# Patient Record
Sex: Male | Born: 1985 | Race: Asian | Hispanic: No | Marital: Married | Smoking: Former smoker
Health system: Southern US, Community
[De-identification: ages and names within clinical notes are randomized; demographics above are authoritative.]

## PROBLEM LIST (undated history)

## (undated) DIAGNOSIS — M5020 Other cervical disc displacement, unspecified cervical region: Secondary | ICD-10-CM

## (undated) DIAGNOSIS — Z8619 Personal history of other infectious and parasitic diseases: Secondary | ICD-10-CM

## (undated) HISTORY — DX: Other cervical disc displacement, unspecified cervical region: M50.20

## (undated) HISTORY — DX: Personal history of other infectious and parasitic diseases: Z86.19

---

## 2016-01-16 ENCOUNTER — Ambulatory Visit (INDEPENDENT_AMBULATORY_CARE_PROVIDER_SITE_OTHER): Payer: 59 | Admitting: Physician Assistant

## 2016-01-16 VITALS — BP 118/80 | HR 78 | Temp 99.2°F | Resp 17 | Ht 63.5 in | Wt 160.0 lb

## 2016-01-16 DIAGNOSIS — B0239 Other herpes zoster eye disease: Secondary | ICD-10-CM | POA: Diagnosis not present

## 2016-01-16 DIAGNOSIS — B029 Zoster without complications: Secondary | ICD-10-CM | POA: Diagnosis not present

## 2016-01-16 MED ORDER — DICLOFENAC SODIUM 75 MG PO TBEC: 75.0000 mg | DELAYED_RELEASE_TABLET | Freq: Two times a day (BID) | ORAL | Status: DC

## 2016-01-16 MED ORDER — VALACYCLOVIR HCL 1 G PO TABS: 1000.0000 mg | ORAL_TABLET | Freq: Three times a day (TID) | ORAL | Status: DC

## 2016-01-16 NOTE — Progress Notes (Signed)
[facility] [address] [place] [address] [phone]  Date:  01/16/2016   Name:  Tyler Daniels   DOB:  [birth year]   MRN:  [id number]  PCP:  No PCP Per Patient    Chief Complaint: Rash   History of Present Illness:  This is a 30 y.o. male who is presenting with a facial rash x 2 days. Started at right hairline. Has been spreading down forehead and only right eyelid. States it itches and burns. He has never had anything like this before. Had chicken pox as a child. Denies problems with hearing or vision. Denies fever or chills. He tried benadryl cream and not helping. He works as an Regulatory affairs officer.  Review of Systems:  Review of Systems See HPI  There are no active problems to display for this patient.   Prior to Admission medications   Not on File    No Known Allergies  History reviewed. No pertinent past surgical history.  Social History  Substance Use Topics  . Smoking status: Never Smoker   . Smokeless tobacco: None  . Alcohol Use: None    History reviewed. No pertinent family history.  Medication list has been reviewed and updated.  Physical Examination:  Physical Exam  Constitutional: He is oriented to person, place, and time. He appears well-developed and well-nourished. No distress.  HENT:  Head: Normocephalic and atraumatic.  Right Ear: Hearing, tympanic membrane, external ear and ear canal normal.  Left Ear: Hearing, tympanic membrane, external ear and ear canal normal.  Nose: Nose normal.  Mouth/Throat: Uvula is midline, oropharynx is clear and moist and mucous membranes are normal.  Eyes: Conjunctivae and EOM are normal. Pupils are equal, round, and reactive to light. Right eye exhibits no discharge. Left eye exhibits no discharge. No scleral icterus.  Vesicular rash over right forehead, into right scalp and one lesion on right upper eyelid.  Cardiovascular: Normal rate, regular rhythm, normal heart sounds and normal pulses.    No murmur heard. Pulmonary/Chest: Effort normal and breath sounds normal. No respiratory distress. He has no wheezes. He has no rhonchi. He has no rales.  Musculoskeletal: Normal range of motion.  Lymphadenopathy:       Head (right side): Posterior auricular adenopathy present. No submental, no submandibular, no tonsillar, no preauricular and no occipital adenopathy present.       Head (left side): No submental, no submandibular, no tonsillar, no preauricular and no occipital adenopathy present.    He has no cervical adenopathy.       Right: No supraclavicular adenopathy present.       Left: No supraclavicular adenopathy present.  Neurological: He is alert and oriented to person, place, and time.  Skin: Skin is warm, dry and intact.  Psychiatric: He has a normal mood and affect. His speech is normal and behavior is normal. Thought content normal.   BP 118/80 mmHg  Pulse 78  Temp(Src) 99.2 F (37.3 C) (Oral)  Resp 17  Ht 5' 3.5" (1.613 m)  Wt 160 lb (72.576 kg)  BMI 27.89 kg/m2  SpO2 98%   Visual Acuity Screening   Right eye Left eye Both eyes  Without correction: 20/25-1 20/20-1 20/15-1  With correction:       Assessment and Plan:  1. Herpes zoster Treat with valtrex 1 gm TID x 7 days. voltaren as needed for pain. Referred urgently to ophtho, Dr. Dione Booze, d/t encroachment of rash onto right upper lid and decreased vision in right  eye per vision screen. Return if still having pain in 3-4 weeks. - valACYclovir (VALTREX) 1000 MG tablet; Take 1 tablet (1,000 mg total) by mouth 3 (three) times daily.  Dispense: 21 tablet; Refill: 0 - Ambulatory referral to Ophthalmology - diclofenac (VOLTAREN) 75 MG EC tablet; Take 1 tablet (75 mg total) by mouth 2 (two) times daily.  Dispense: 30 tablet; Refill: 0   Roswell Miners. Dyke Brackett, MHS [facility]  01/16/2016

## 2016-01-16 NOTE — Patient Instructions (Addendum)
Take valtrex three times a day for 7 days. Take voltaren as needed for pain Follow up with Dr. Dione Booze at 2:15 today

## 2016-01-24 DIAGNOSIS — B0239 Other herpes zoster eye disease: Secondary | ICD-10-CM | POA: Diagnosis not present

## 2016-03-20 ENCOUNTER — Ambulatory Visit (INDEPENDENT_AMBULATORY_CARE_PROVIDER_SITE_OTHER): Payer: 59 | Admitting: Emergency Medicine

## 2016-03-20 VITALS — BP 122/74 | HR 84 | Temp 99.2°F | Resp 16 | Ht 64.0 in | Wt 156.0 lb

## 2016-03-20 DIAGNOSIS — R05 Cough: Secondary | ICD-10-CM

## 2016-03-20 DIAGNOSIS — J101 Influenza due to other identified influenza virus with other respiratory manifestations: Secondary | ICD-10-CM | POA: Diagnosis not present

## 2016-03-20 DIAGNOSIS — R059 Cough, unspecified: Secondary | ICD-10-CM

## 2016-03-20 DIAGNOSIS — J029 Acute pharyngitis, unspecified: Secondary | ICD-10-CM

## 2016-03-20 LAB — POCT INFLUENZA A/B
Influenza A, POC: NEGATIVE
Influenza B, POC: POSITIVE — AB

## 2016-03-20 LAB — POCT RAPID STREP A (OFFICE): Rapid Strep A Screen: NEGATIVE

## 2016-03-20 MED ORDER — FIRST-DUKES MOUTHWASH MT SUSP: OROMUCOSAL | Status: DC

## 2016-03-20 MED ORDER — OSELTAMIVIR PHOSPHATE 75 MG PO CAPS
75.0000 mg | ORAL_CAPSULE | Freq: Two times a day (BID) | ORAL | Status: DC
Start: 2016-03-20 — End: 2017-01-09

## 2016-03-20 NOTE — Patient Instructions (Addendum)
   IF you received an x-ray today, you will receive an invoice from [facility]. Please contact [facility] at [phone] with questions or concerns regarding your invoice.   IF you received labwork today, you will receive an invoice from [facility]. Please contact [facility] at [phone] with questions or concerns regarding your invoice.   Our billing staff will not be able to assist you with questions regarding bills from these companies.  You will be contacted with the lab results as soon as they are available. The fastest way to get your results is to activate your My Chart account. Instructions are located on the last page of this paperwork. If you have not heard from us regarding the results in 2 weeks, please contact this office.     Influenza, Adult Influenza ("the flu") is a viral infection of the respiratory tract. It occurs more often in winter months because people spend more time in close contact with one another. Influenza can make you feel very sick. Influenza easily spreads from person to person (contagious). CAUSES  Influenza is caused by a virus that infects the respiratory tract. You can catch the virus by breathing in droplets from an infected person's cough or sneeze. You can also catch the virus by touching something that was recently contaminated with the virus and then touching your mouth, nose, or eyes. RISKS AND COMPLICATIONS You may be at risk for a more severe case of influenza if you smoke cigarettes, have diabetes, have chronic heart disease (such as heart failure) or lung disease (such as asthma), or if you have a weakened immune system. Elderly people and pregnant women are also at risk for more serious infections. The most common problem of influenza is a lung infection (pneumonia). Sometimes, this problem can require emergency medical care and may be life threatening. SIGNS AND SYMPTOMS  Symptoms typically last 4  to 10 days and may include:  Fever.  Chills.  Headache, body aches, and muscle aches.  Sore throat.  Chest discomfort and cough.  Poor appetite.  Weakness or feeling tired.  Dizziness.  Nausea or vomiting. DIAGNOSIS  Diagnosis of influenza is often made based on your history and a physical exam. A nose or throat swab test can be done to confirm the diagnosis. TREATMENT  In mild cases, influenza goes away on its own. Treatment is directed at relieving symptoms. For more severe cases, your health care provider may prescribe antiviral medicines to shorten the sickness. Antibiotic medicines are not effective because the infection is caused by a virus, not by bacteria. HOME CARE INSTRUCTIONS  Take medicines only as directed by your health care provider.  Use a cool mist humidifier to make breathing easier.  Get plenty of rest until your temperature returns to normal. This usually takes 3 to 4 days.  Drink enough fluid to keep your urine clear or pale yellow.  Cover yourmouth and nosewhen coughing or sneezing,and wash your handswellto prevent thevirusfrom spreading.  Stay homefromwork orschool untilthe fever is gonefor at least 1full day. PREVENTION  An annual influenza vaccination (flu shot) is the best way to avoid getting influenza. An annual flu shot is now routinely recommended for all adults in the [place] SEEK MEDICAL CARE IF:  You experiencechest pain, yourcough worsens,or you producemore mucus.  Youhave nausea,vomiting, ordiarrhea.  Your fever returns or gets worse. SEEK IMMEDIATE MEDICAL CARE IF:  You havetrouble breathing, you become short of breath,or your skin ornails becomebluish.  You have severe painor   stiffnessin the neck.  You develop a sudden headache, or pain in the face or ear.  You have nausea or vomiting that you cannot control. MAKE SURE YOU:   Understand these instructions.  Will watch your condition.  Will get help  right away if you are not doing well or get worse.   This information is not intended to replace advice given to you by your health care provider. Make sure you discuss any questions you have with your health care provider.   Document Released: 11/22/2000 Document Revised: 12/16/2014 Document Reviewed: 02/24/2012 Elsevier Interactive Patient Education 2016 Elsevier Inc.  

## 2016-03-20 NOTE — Progress Notes (Signed)
Patient ID: Tyler Daniels, male   DOB: 1986-05-16, 30 y.o.   MRN: [id number]    By signing my name below, I, Essence Howell, attest that this documentation has been prepared under the direction and in the presence of Collene GobbleSteven A Marck Mcclenny, MD Electronically Signed: Charline BillsEssence Howell, ED Scribe 03/20/2016 at 1:19 PM.  Chief Complaint:  Chief Complaint  Patient presents with  . Sore Throat  . Generalized Body Aches   HPI: Tyler Daniels is a 30 y.o. male who [facility] complaining of persistent fever for the past 3 days. Triage temperature 99.2 F. Pt reports associated symptoms of chills, dizziness, gradually worsening sore throat, productive cough with yellow sputum. No treatments tried PTA. He denies myalgias. Pt reports potential sick contacts at his job. He did receive an influenza vaccine this season.   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    History reviewed. No pertinent past medical history. History reviewed. No pertinent past surgical history. Social History   Social History  . Marital Status: Married    Spouse Name: N/A  . Number of Children: N/A  . Years of Education: N/A   Social History Main Topics  . Smoking status: Never Smoker   . Smokeless tobacco: None  . Alcohol Use: None  . Drug Use: None  . Sexual Activity: Not Asked   Other Topics Concern  . None   Social History Narrative   History reviewed. No pertinent family history. No Known Allergies Prior to Admission medications   Medication Sig Start Date End Date Taking? Authorizing Provider  diclofenac (VOLTAREN) 75 MG EC tablet Take 1 tablet (75 mg total) by mouth 2 (two) times daily. Patient not taking: Reported on 03/20/2016 01/16/16   Dorna LeitzNicole Bush V, PA-C  valACYclovir (VALTREX) 1000 MG tablet Take 1 tablet (1,000 mg total) by mouth 3 (three) times daily. Patient not taking: Reported on 03/20/2016 01/16/16   Dorna LeitzNicole Bush V, PA-C   ROS: The patient denies night sweats, unintentional  weight loss, chest pain, palpitations, wheezing, dyspnea on exertion, nausea, vomiting, abdominal pain, dysuria, hematuria, melena, numbness, weakness, or tingling. +fever, +chills, +cough, +sore throat, +dizziness   All other systems have been reviewed and were otherwise negative with the exception of those mentioned in the HPI and as above.    PHYSICAL EXAM: Filed Vitals:   03/20/16 1254  BP: 122/74  Pulse: 84  Temp: 99.2 F (37.3 C)  Resp: 16   Body mass index is 26.76 kg/(m^2).  General: Alert, no acute distress HEENT:  Normocephalic, atraumatic, oropharynx patent. TMs normal. Nose is normal. Throat is red.  Eye: Nonie [facility]OMI, [facility] Cardiovascular: Regular rate and rhythm, no rubs murmurs or gallops. No Carotid bruits, radial pulse intact. No pedal edema.  Respiratory: Clear to auscultation bilaterally. No wheezes, rales, or rhonchi. No cyanosis, no use of accessory musculature Abdominal: No organomegaly, abdomen is soft and non-tender, positive bowel sounds. No masses. Musculoskeletal: Gait intact. No edema, tenderness Skin: No rashes. Neurologic: Facial musculature symmetric. Psychiatric: Patient acts appropriately throughout our interaction. Lymphatic: No cervical or submandibular lymphadenopathy  LABS:  EKG/XRAY:   Primary [facility]  ASSESSMENT/PLAN: Patient tested positive for influenza B. Strep screen was negative. He will be treated with Tamiflu and Dukes.I personally performed the services described in this documentation, which was scribed in my presence. The recorded information has been reviewed and is accurate.    Gross sideeffects, risk and benefits,  and alternatives of medications d/w patient. Patient is aware that all medications have potential sideeffects and we are unable to predict every sideeffect or drug-drug interaction that may occur.  Lesle Chris MD 03/20/2016 1:11 PM

## 2016-03-22 LAB — CULTURE, GROUP A STREP: ORGANISM ID, BACTERIA: NORMAL

## 2017-01-09 ENCOUNTER — Ambulatory Visit (INDEPENDENT_AMBULATORY_CARE_PROVIDER_SITE_OTHER): Payer: 59 | Admitting: Family Medicine

## 2017-01-09 VITALS — BP 116/72 | HR 66 | Temp 98.0°F | Resp 16 | Ht 64.0 in | Wt 162.2 lb

## 2017-01-09 DIAGNOSIS — J029 Acute pharyngitis, unspecified: Secondary | ICD-10-CM

## 2017-01-09 LAB — POCT RAPID STREP A (OFFICE): RAPID STREP A SCREEN: NEGATIVE

## 2017-01-09 NOTE — Patient Instructions (Addendum)
Gargle with warm salt water and take Ibuprofen.  Your throat culture is pending.   IF you received an x-ray today, you will receive an invoice from [facility]. Please contact [facility] at [phone] with questions or concerns regarding your invoice.   IF you received labwork today, you will receive an invoice from [facility]. Please contact [facility] at [phone] with questions or concerns regarding your invoice.   Our billing staff will not be able to assist you with questions regarding bills from these companies.  You will be contacted with the lab results as soon as they are available. The fastest way to get your results is to activate your My Chart account. Instructions are located on the last page of this paperwork. If you have not heard from us regarding the results in 2 weeks, please contact this office.      Sore Throat When you have a sore throat, your throat may:  Hurt.  Burn.  Feel irritated.  Feel scratchy. Many things can cause a sore throat, including:  An infection.  Allergies.  Dryness in the air.  Smoke or pollution.  Gastroesophageal reflux disease (GERD).  A tumor. A sore throat can be the first sign of another sickness. It can happen with other problems, like coughing or a fever. Most sore throats go away without treatment. Follow these instructions at home:  Take over-the-counter medicines only as told by your doctor.  Drink enough fluids to keep your pee (urine) clear or pale yellow.  Rest when you feel you need to.  To help with pain, try:  Sipping warm liquids, such as broth, herbal tea, or warm water.  Eating or drinking cold or frozen liquids, such as frozen ice pops.  Gargling with a salt-water mixture 3-4 times a day or as needed. To make a salt-water mixture, add -1 tsp of salt in 1 cup of warm water. Mix it until you cannot see the salt anymore.  Sucking on hard candy or throat lozenges.  Putting a  cool-mist humidifier in your bedroom at night.  Sitting in the bathroom with the door closed for 5-10 minutes while you run hot water in the shower.  Do not use any tobacco products, such as cigarettes, chewing tobacco, and e-cigarettes. If you need help quitting, ask your doctor. Contact a doctor if:  You have a fever for more than 2-3 days.  You keep having symptoms for more than 2-3 days.  Your throat does not get better in 7 days.  You have a fever and your symptoms suddenly get worse. Get help right away if:  You have trouble breathing.  You cannot swallow fluids, soft foods, or your saliva.  You have swelling in your throat or neck that gets worse.  You keep feeling like you are going to throw up (vomit).  You keep throwing up. This information is not intended to replace advice given to you by your health care provider. Make sure you discuss any questions you have with your health care provider. Document Released: 09/03/2008 Document Revised: 07/21/2016 Document Reviewed: 09/15/2015 Elsevier Interactive Patient Education  2017 [employer]

## 2017-01-09 NOTE — Progress Notes (Signed)
   Patient ID: Tyler Daniels, male    DOB: 05/11/1986, 31 y.o.   MRN: [id number]  PCP: No PCP Per Patient  Chief Complaint  Patient presents with  . Sore Throat    x1 day and getting worse  . Headache    states its an slight headache  . Dizziness    Subjective:  HPI 31 year old male presents for evaluation of sore throat, headache, and dizziness x 1 day. Entire throat is painful with swallowing. Patient also complains of persistent headache and with dizziness began yesterday. Patient denies nasal congestion and ear pain. 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 He denies taking any medication. Reports that his sore throat is the most troublesome symptom.   Social History   Social History  . Marital status: Married    Spouse name: N/A  . Number of children: N/A  . Years of education: N/A   Occupational History  . Not on file.   Social History Main Topics  . Smoking status: Never Smoker  . Smokeless tobacco: Never Used  . Alcohol use Not on file  . Drug use: Unknown  . Sexual activity: Not on file   Other Topics Concern  . Not on file   Social History Narrative  . No narrative on file   Review of Systems See HPI  Prior to Admission medications   Not on File    Past Medical, Surgical Family and Social History reviewed and updated.    Objective:   Today's Vitals   01/09/17 1215  BP: 116/72  Pulse: 66  Resp: 16  Temp: 98 F (36.7 C)  TempSrc: Oral  SpO2: 99%  Weight: 162 lb 3.2 oz (73.6 kg)  Height: 5\' 4"  (1.626 m)    Wt Readings from Last 3 Encounters:  01/09/17 162 lb 3.2 oz (73.6 kg)  03/20/16 156 lb (70.8 kg)  01/16/16 160 lb (72.6 kg)   Physical Exam  Constitutional: He is oriented to person, place, and time. He appears well-developed and well-nourished.  HENT:  Right Ear: Hearing, tympanic membrane, external ear and ear canal normal.  Left Ear: Hearing, tympanic membrane, external ear and ear canal normal.  Nose:  Mucosal edema and rhinorrhea present.  Mouth/Throat: Uvula is midline and mucous membranes are normal. No oropharyngeal exudate, posterior oropharyngeal edema, posterior oropharyngeal erythema or tonsillar abscesses.  Eyes: Conjunctivae are normal. Pupils are equal, round, and reactive to light.  Neck: Normal range of motion. Neck supple.  Cardiovascular: Normal rate, regular rhythm, normal heart sounds and intact distal pulses.   Pulmonary/Chest: Effort normal and breath sounds normal.  Lymphadenopathy:    He has no cervical adenopathy.  Neurological: He is alert and oriented to person, place, and time.  Skin: Skin is warm and dry.  Psychiatric: He has a normal mood and affect. His behavior is normal. Judgment and thought content normal.     Assessment & Plan:  1. Sore throat - POCT rapid strep A - Culture, Group A Strep  Gargle with warm salt water and take Ibuprofen. Your throat culture is pending.  Follow-up as needed or if symptoms worsen.   Godfrey PickKimberly S. Tiburcio PeaHarris, MSN, FNP-C Primary Care at [facility] [phone]

## 2017-01-13 LAB — CULTURE, GROUP A STREP: Strep A Culture: NEGATIVE

## 2017-11-06 DIAGNOSIS — Z0120 Encounter for dental examination and cleaning without abnormal findings: Secondary | ICD-10-CM | POA: Diagnosis not present

## 2018-02-13 DIAGNOSIS — H52223 Regular astigmatism, bilateral: Secondary | ICD-10-CM | POA: Diagnosis not present

## 2019-11-12 DIAGNOSIS — H52223 Regular astigmatism, bilateral: Secondary | ICD-10-CM | POA: Diagnosis not present

## 2021-01-17 DIAGNOSIS — H52223 Regular astigmatism, bilateral: Secondary | ICD-10-CM | POA: Diagnosis not present

## 2021-04-28 DIAGNOSIS — M9902 Segmental and somatic dysfunction of thoracic region: Secondary | ICD-10-CM | POA: Diagnosis not present

## 2021-04-28 DIAGNOSIS — M9901 Segmental and somatic dysfunction of cervical region: Secondary | ICD-10-CM | POA: Diagnosis not present

## 2021-04-28 DIAGNOSIS — M531 Cervicobrachial syndrome: Secondary | ICD-10-CM | POA: Diagnosis not present

## 2021-04-28 DIAGNOSIS — M791 Myalgia, unspecified site: Secondary | ICD-10-CM | POA: Diagnosis not present

## 2021-05-09 DIAGNOSIS — M9901 Segmental and somatic dysfunction of cervical region: Secondary | ICD-10-CM | POA: Diagnosis not present

## 2021-05-09 DIAGNOSIS — M9902 Segmental and somatic dysfunction of thoracic region: Secondary | ICD-10-CM | POA: Diagnosis not present

## 2021-05-09 DIAGNOSIS — M791 Myalgia, unspecified site: Secondary | ICD-10-CM | POA: Diagnosis not present

## 2021-05-09 DIAGNOSIS — M531 Cervicobrachial syndrome: Secondary | ICD-10-CM | POA: Diagnosis not present

## 2021-05-16 DIAGNOSIS — M791 Myalgia, unspecified site: Secondary | ICD-10-CM | POA: Diagnosis not present

## 2021-05-16 DIAGNOSIS — M9901 Segmental and somatic dysfunction of cervical region: Secondary | ICD-10-CM | POA: Diagnosis not present

## 2021-05-16 DIAGNOSIS — M531 Cervicobrachial syndrome: Secondary | ICD-10-CM | POA: Diagnosis not present

## 2021-05-16 DIAGNOSIS — M9902 Segmental and somatic dysfunction of thoracic region: Secondary | ICD-10-CM | POA: Diagnosis not present

## 2021-05-26 DIAGNOSIS — M9902 Segmental and somatic dysfunction of thoracic region: Secondary | ICD-10-CM | POA: Diagnosis not present

## 2021-05-26 DIAGNOSIS — M9901 Segmental and somatic dysfunction of cervical region: Secondary | ICD-10-CM | POA: Diagnosis not present

## 2021-05-26 DIAGNOSIS — M791 Myalgia, unspecified site: Secondary | ICD-10-CM | POA: Diagnosis not present

## 2021-05-26 DIAGNOSIS — M531 Cervicobrachial syndrome: Secondary | ICD-10-CM | POA: Diagnosis not present

## 2021-06-06 DIAGNOSIS — M9901 Segmental and somatic dysfunction of cervical region: Secondary | ICD-10-CM | POA: Diagnosis not present

## 2021-06-06 DIAGNOSIS — M531 Cervicobrachial syndrome: Secondary | ICD-10-CM | POA: Diagnosis not present

## 2021-06-06 DIAGNOSIS — M791 Myalgia, unspecified site: Secondary | ICD-10-CM | POA: Diagnosis not present

## 2021-06-06 DIAGNOSIS — M9902 Segmental and somatic dysfunction of thoracic region: Secondary | ICD-10-CM | POA: Diagnosis not present

## 2021-06-27 DIAGNOSIS — Z3189 Encounter for other procreative management: Secondary | ICD-10-CM | POA: Diagnosis not present

## 2021-06-30 DIAGNOSIS — M9901 Segmental and somatic dysfunction of cervical region: Secondary | ICD-10-CM | POA: Diagnosis not present

## 2021-06-30 DIAGNOSIS — M9902 Segmental and somatic dysfunction of thoracic region: Secondary | ICD-10-CM | POA: Diagnosis not present

## 2021-06-30 DIAGNOSIS — M531 Cervicobrachial syndrome: Secondary | ICD-10-CM | POA: Diagnosis not present

## 2021-06-30 DIAGNOSIS — M791 Myalgia, unspecified site: Secondary | ICD-10-CM | POA: Diagnosis not present

## 2021-07-04 DIAGNOSIS — M9902 Segmental and somatic dysfunction of thoracic region: Secondary | ICD-10-CM | POA: Diagnosis not present

## 2021-07-04 DIAGNOSIS — M9901 Segmental and somatic dysfunction of cervical region: Secondary | ICD-10-CM | POA: Diagnosis not present

## 2021-07-04 DIAGNOSIS — M791 Myalgia, unspecified site: Secondary | ICD-10-CM | POA: Diagnosis not present

## 2021-07-04 DIAGNOSIS — M531 Cervicobrachial syndrome: Secondary | ICD-10-CM | POA: Diagnosis not present

## 2021-07-11 DIAGNOSIS — M9901 Segmental and somatic dysfunction of cervical region: Secondary | ICD-10-CM | POA: Diagnosis not present

## 2021-07-11 DIAGNOSIS — M531 Cervicobrachial syndrome: Secondary | ICD-10-CM | POA: Diagnosis not present

## 2021-07-11 DIAGNOSIS — M791 Myalgia, unspecified site: Secondary | ICD-10-CM | POA: Diagnosis not present

## 2021-07-11 DIAGNOSIS — M9902 Segmental and somatic dysfunction of thoracic region: Secondary | ICD-10-CM | POA: Diagnosis not present

## 2021-07-12 DIAGNOSIS — S161XXA Strain of muscle, fascia and tendon at neck level, initial encounter: Secondary | ICD-10-CM | POA: Diagnosis not present

## 2021-07-12 DIAGNOSIS — Z6825 Body mass index (BMI) 25.0-25.9, adult: Secondary | ICD-10-CM | POA: Diagnosis not present

## 2021-07-18 DIAGNOSIS — M898X1 Other specified disorders of bone, shoulder: Secondary | ICD-10-CM | POA: Diagnosis not present

## 2021-07-18 DIAGNOSIS — S161XXD Strain of muscle, fascia and tendon at neck level, subsequent encounter: Secondary | ICD-10-CM | POA: Diagnosis not present

## 2021-07-18 DIAGNOSIS — Z6826 Body mass index (BMI) 26.0-26.9, adult: Secondary | ICD-10-CM | POA: Diagnosis not present

## 2021-07-30 DIAGNOSIS — M5410 Radiculopathy, site unspecified: Secondary | ICD-10-CM | POA: Diagnosis not present

## 2021-08-07 ENCOUNTER — Other Ambulatory Visit (HOSPITAL_COMMUNITY): Payer: Self-pay | Admitting: Orthopedic Surgery

## 2021-08-08 ENCOUNTER — Other Ambulatory Visit: Payer: Self-pay | Admitting: Orthopedic Surgery

## 2021-08-08 DIAGNOSIS — M79601 Pain in right arm: Secondary | ICD-10-CM

## 2021-08-08 DIAGNOSIS — M5410 Radiculopathy, site unspecified: Secondary | ICD-10-CM

## 2021-08-21 ENCOUNTER — Ambulatory Visit
Admission: RE | Admit: 2021-08-21 | Discharge: 2021-08-21 | Disposition: A | Discharge status: Home / Self Care | Payer: 59 | Source: Ambulatory Visit | Attending: Orthopedic Surgery | Admitting: Orthopedic Surgery

## 2021-08-21 DIAGNOSIS — M79601 Pain in right arm: Secondary | ICD-10-CM

## 2021-08-21 DIAGNOSIS — M5410 Radiculopathy, site unspecified: Secondary | ICD-10-CM

## 2021-08-21 DIAGNOSIS — M4722 Other spondylosis with radiculopathy, cervical region: Secondary | ICD-10-CM | POA: Diagnosis not present

## 2021-08-21 DIAGNOSIS — M50123 Cervical disc disorder at C6-C7 level with radiculopathy: Secondary | ICD-10-CM | POA: Diagnosis not present

## 2021-08-21 IMAGING — MR MR MRA NECK W/O CM
3 series · 25 of 48 positions shown · non-contrast
Comparison: Same day cervical spine MRI [DATE].

CLINICAL DATA: Right arm pain. Radiculopathy, unspecified spinal
region. Additional history provided by scanning technologist:
Patient reports right arm and shoulder pain with numbness and
tingling. Pain is persistent and continuous. Symptoms for 6 months.

EXAM:
MRA NECK WITHOUT CONTRAST
TECHNIQUE: Angiographic images of the neck were acquired using MRA technique
without intravenous contrast. Carotid stenosis measurements (when
applicable) are obtained utilizing NASCET criteria, using the distal
internal carotid diameter as the denominator.

[Series 4: fl_tof_2d · axial · 3.5mm · 0.39mm/px · z∈[-71,+142]mm · 13 of 97 slices shown]
[im 1/97]
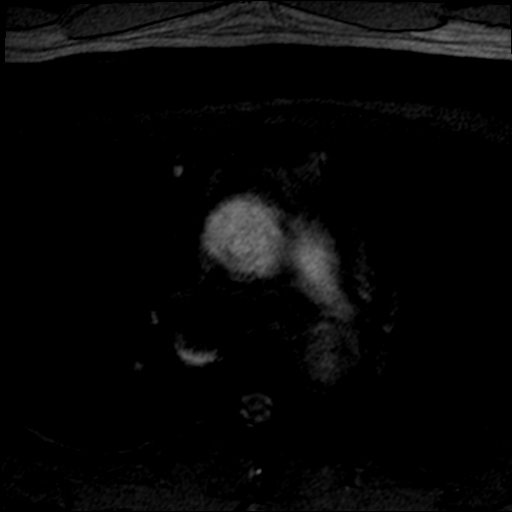
[im 5/97]
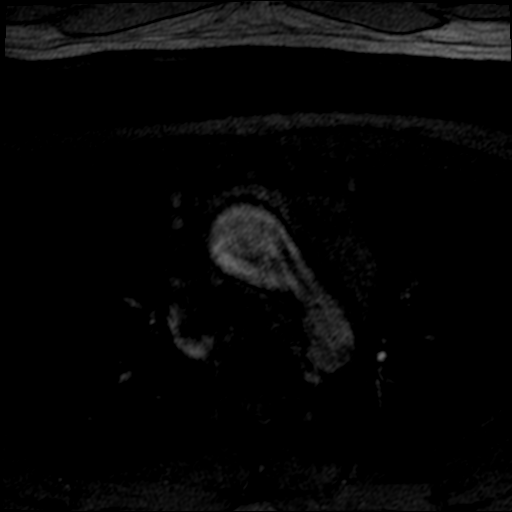
[im 9/97]
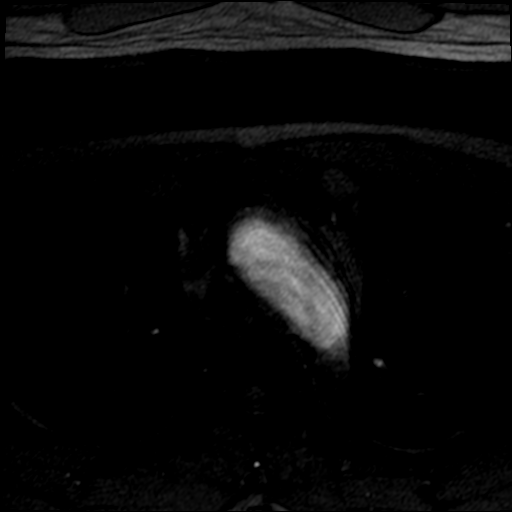
[im 13/97]
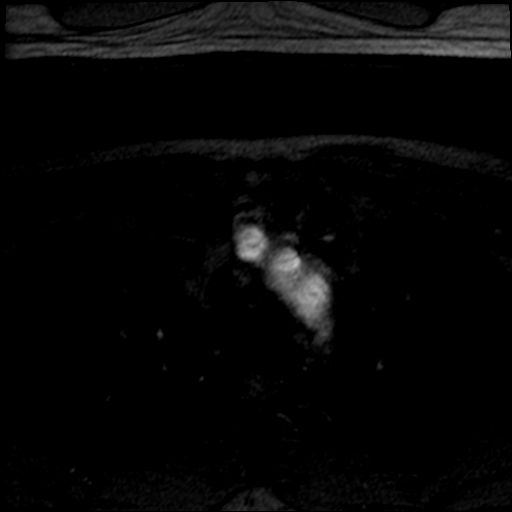
[im 17/97]
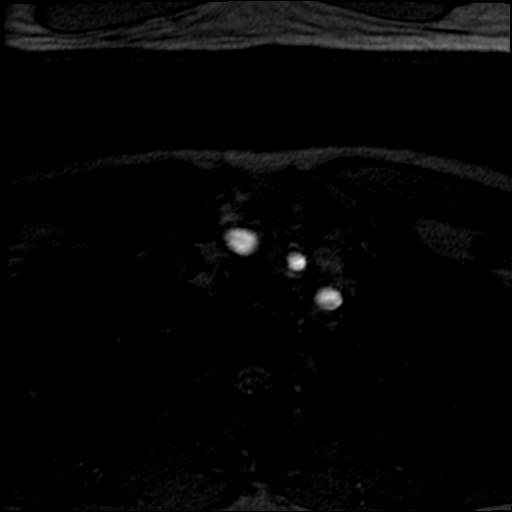
[im 30/97]
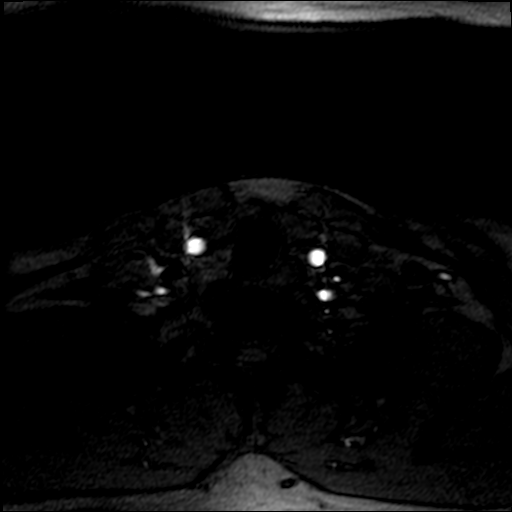
[im 42/97]
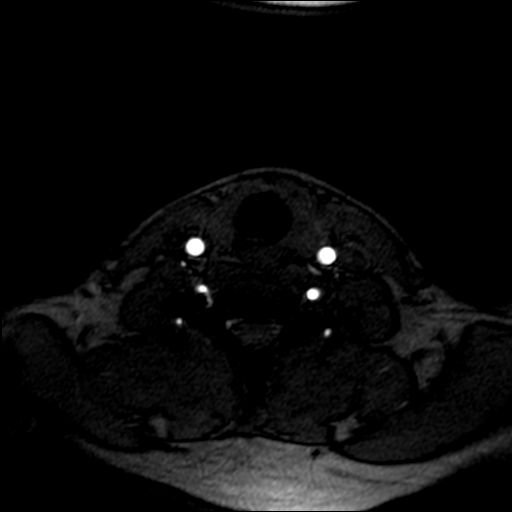
[im 51/97]
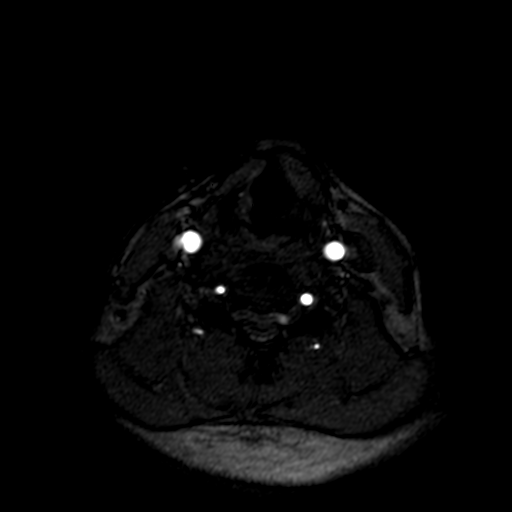
[im 55/97]
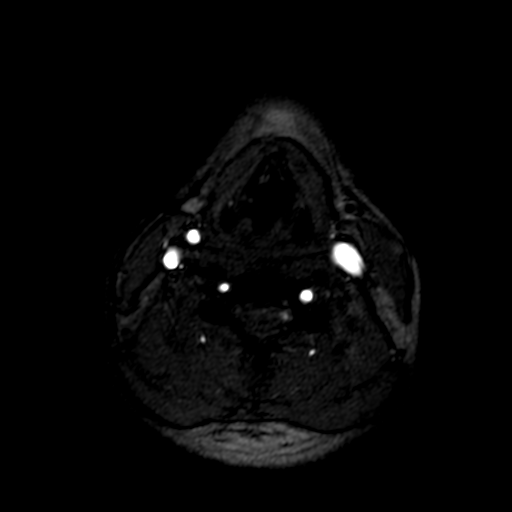
[im 67/97]
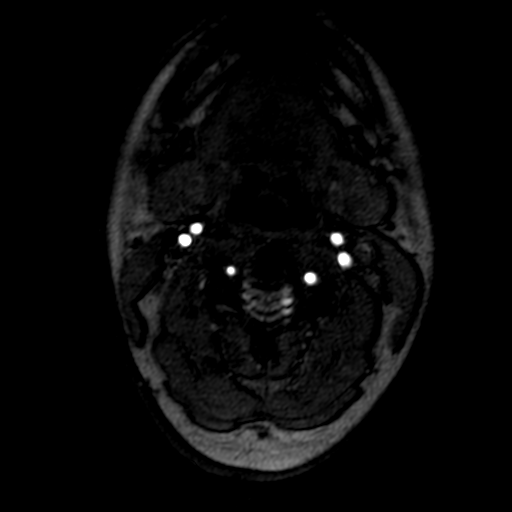
[im 80/97]
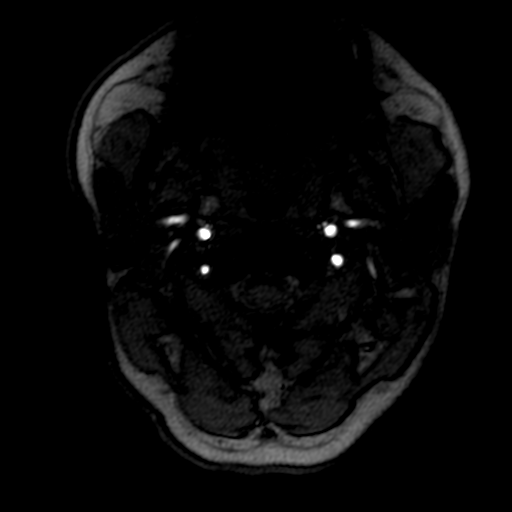
[im 84/97]
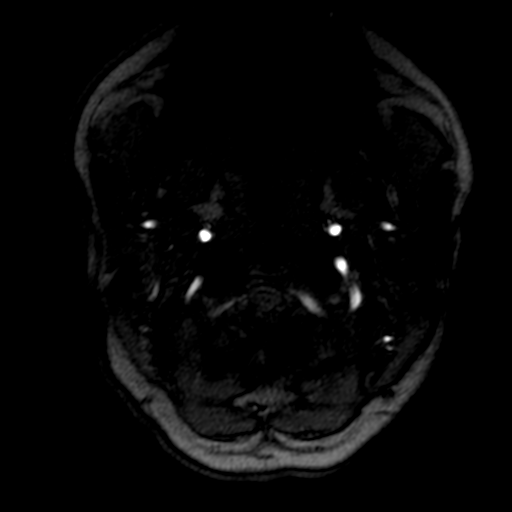
[im 92/97]
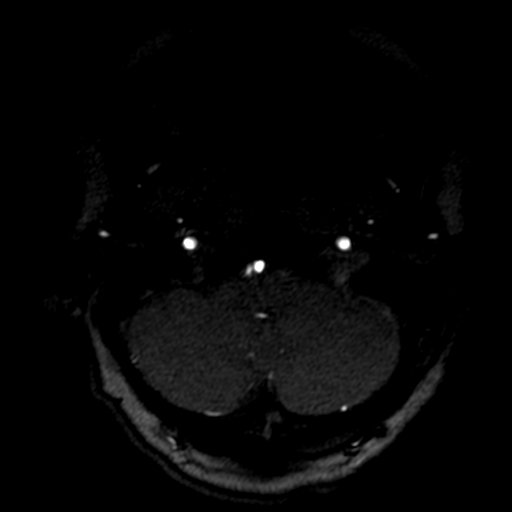

[Series 8: (id)_tt=1.0s · coronal · 0.8mm · 0.78mm/px · 9 of 80 slices shown]
[im 4/80]
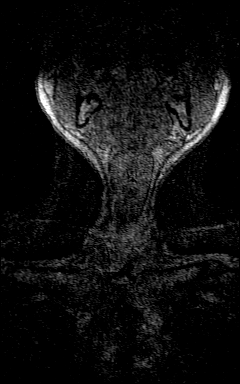
[im 12/80]
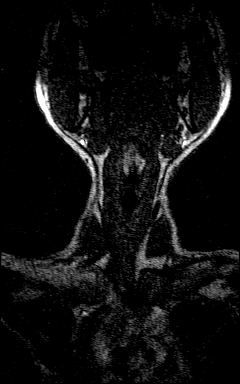
[im 24/80]
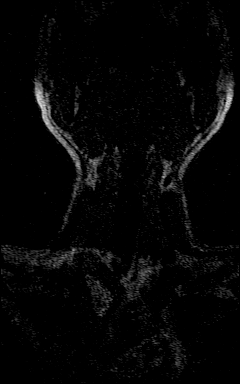
[im 36/80]
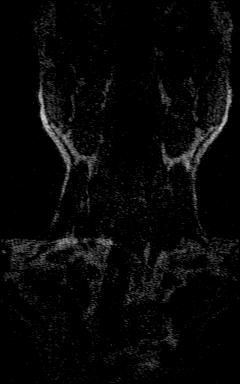
[im 40/80]
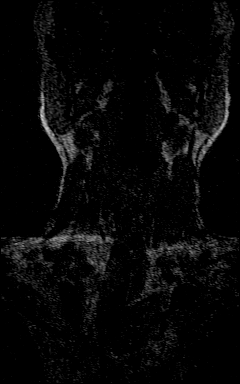
[im 44/80]
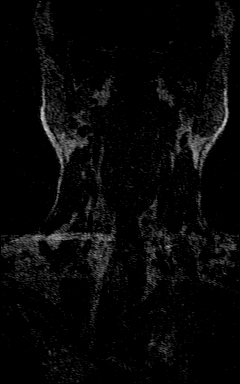
[im 56/80]
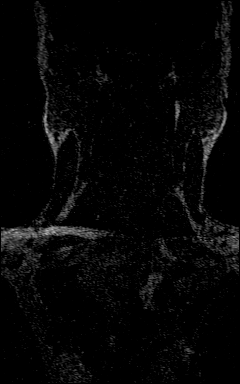
[im 68/80]
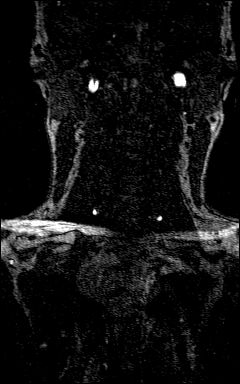
[im 76/80]
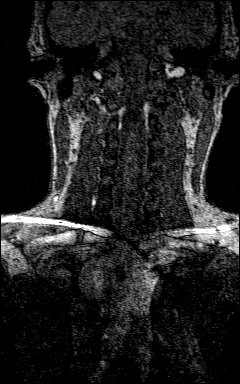

[Series 12: lcca · oblique · 0.45mm/px · 3 of 10 slices shown]
[im 1/10]
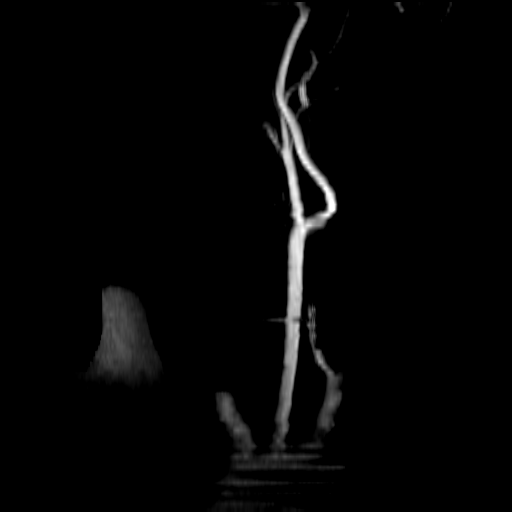
[im 5/10]
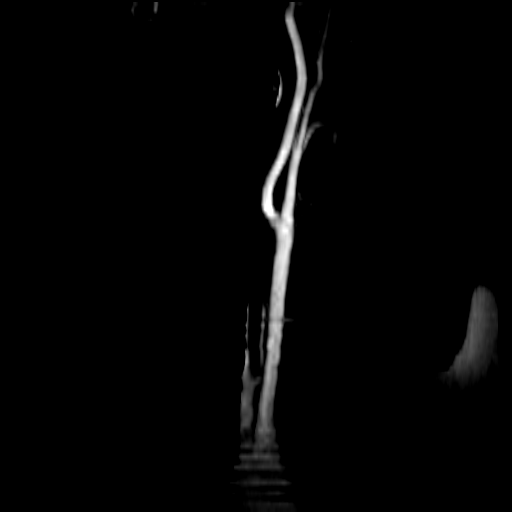
[im 10/10]
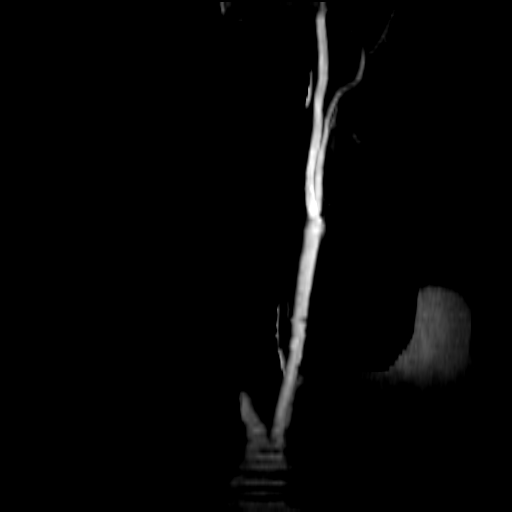

[25 of 48 positions shown; findings below may reference images not displayed]

FINDINGS: Aortic arch: Standard aortic branching. The visualized aortic arch
is normal in caliber. Noncontrast technique precludes evaluation of
the proximal subclavian arteries.

Right carotid system: CCA and ICA patent within the neck without
appreciable stenosis.

Left carotid system: CCA and ICA patent within the neck without
appreciable stenosis.

Vertebral arteries: Vertebral arteries patent within the neck
without appreciable stenosis. The left vertebral artery is dominant.
IMPRESSION: The common carotid, internal carotid and vertebral arteries are
patent within the neck without appreciable stenosis. No evidence of
dissection.

## 2021-08-21 IMAGING — MR MR CERVICAL SPINE W/O CM
5 series · 35 of 48 positions shown · non-contrast
Comparison: Same-day MRA neck [DATE].

CLINICAL DATA: Right arm pain. Radiculopathy, unspecified spinal
region. Additional history provided by scanning technologist:
Patient reports right arm and shoulder pain with numbness and
tingling. Pain is persistent and continuous. Symptoms for 6 months.

EXAM:
MRI CERVICAL SPINE WITHOUT CONTRAST
TECHNIQUE: Multiplanar, multisequence MR imaging of the cervical spine was
performed. No intravenous contrast was administered.

[Series 2: T2 · sagittal · 3.0mm · 0.41mm/px · 8 of 17 slices shown (1 of 2)]
[im 1/17]
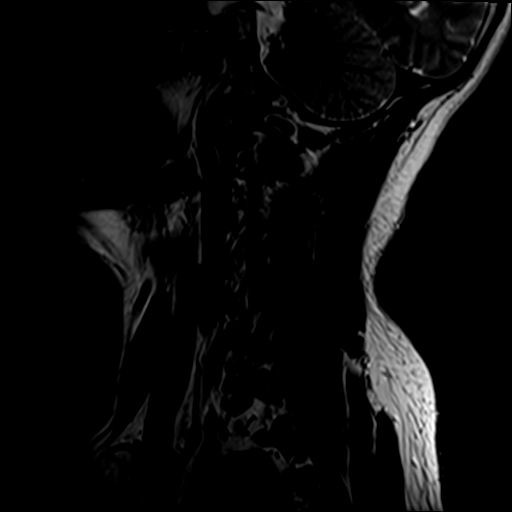
[im 3/17]
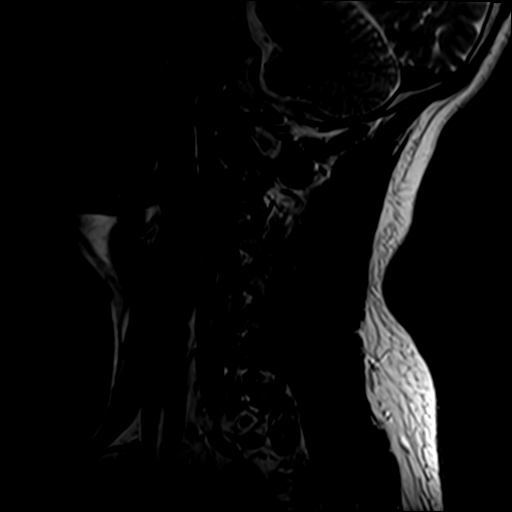
[im 5/17]
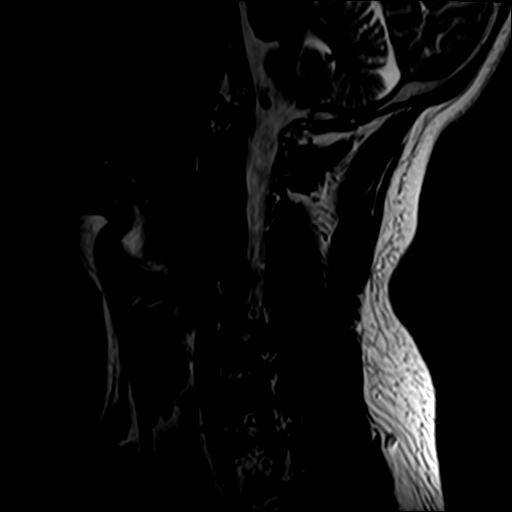
[im 7/17]
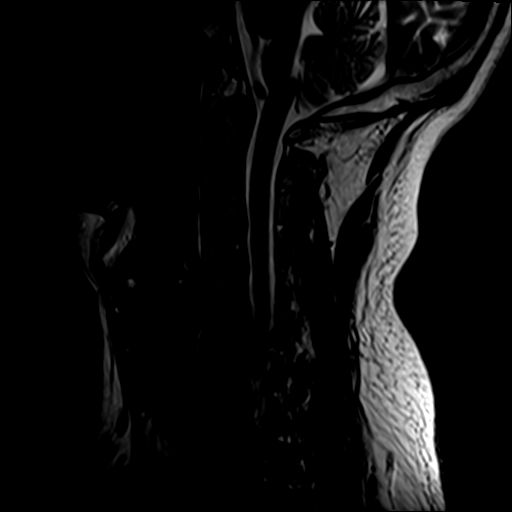
[im 10/17]
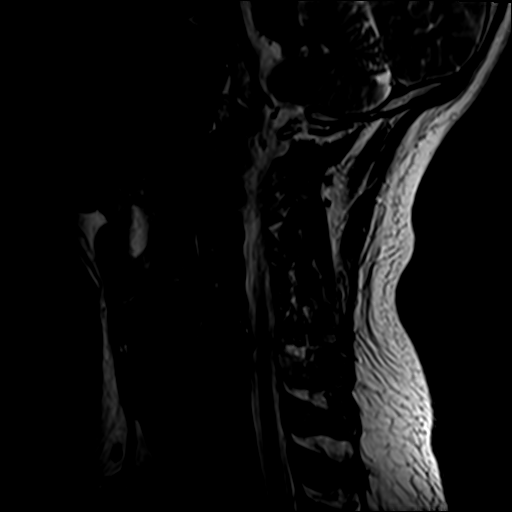
[im 12/17]
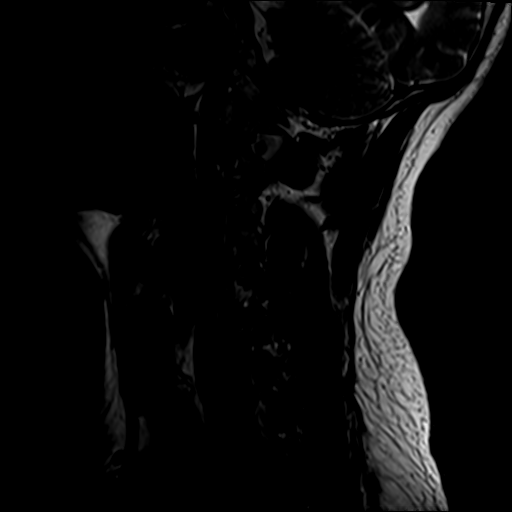
[im 14/17]
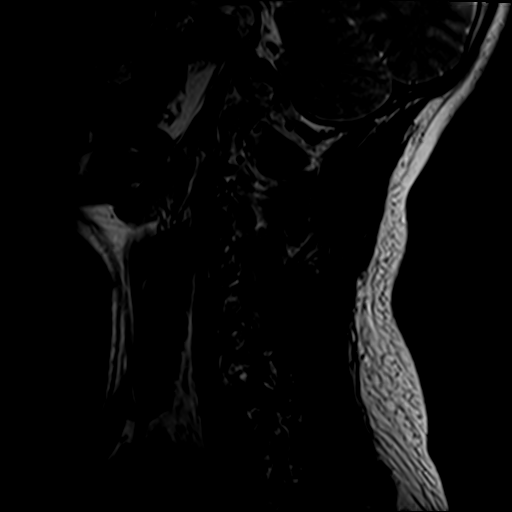
[im 17/17]
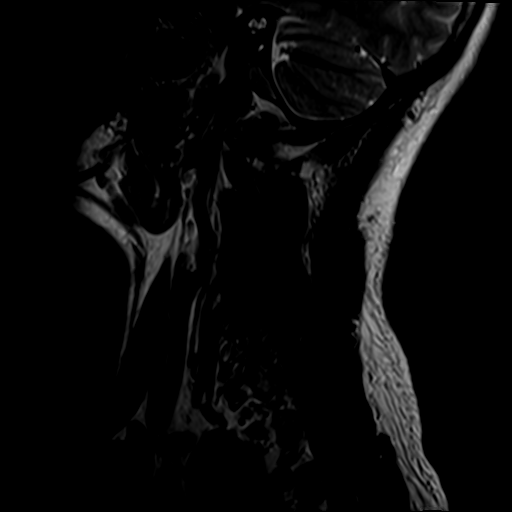

[Series 3: STIR · sagittal · 3.0mm · 0.82mm/px · 8 of 17 slices shown]
[im 1/17]
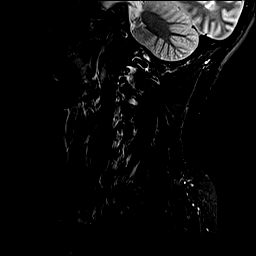
[im 3/17]
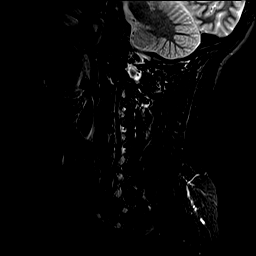
[im 5/17]
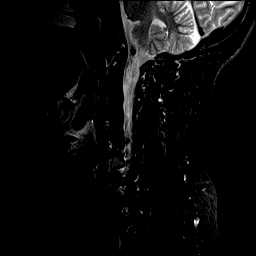
[im 7/17]
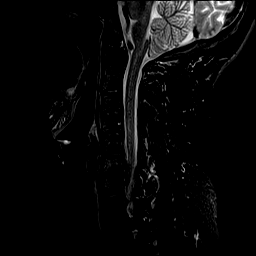
[im 10/17]
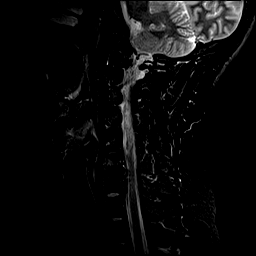
[im 12/17]
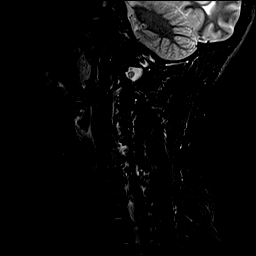
[im 14/17]
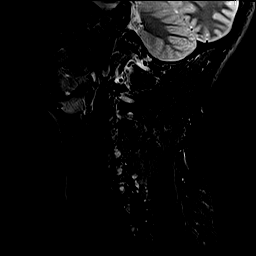
[im 17/17]
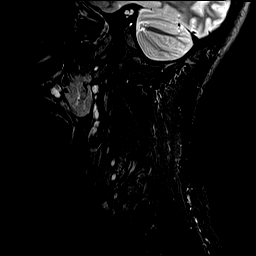

[Series 4: T1 · sagittal · 3.0mm · 0.82mm/px · 8 of 17 slices shown]
[im 1/17]
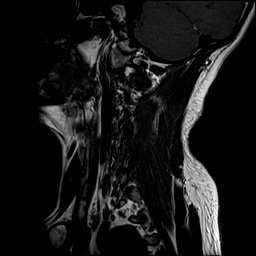
[im 3/17]
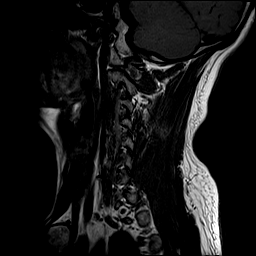
[im 5/17]
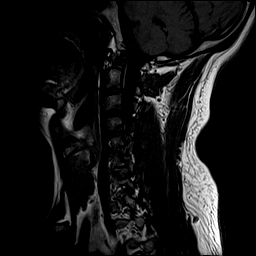
[im 7/17]
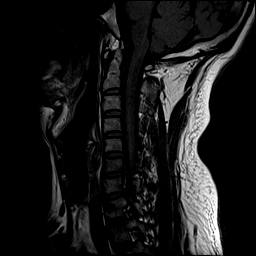
[im 10/17]
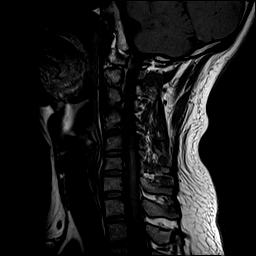
[im 12/17]
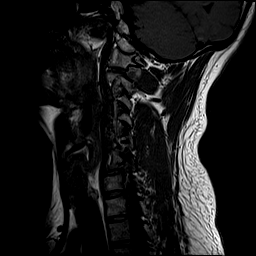
[im 14/17]
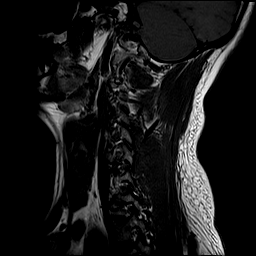
[im 17/17]
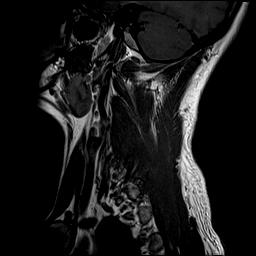

[Series 5: T2 · axial · 3.0mm · 0.70mm/px · z∈[-24,+67]mm · 9 of 26 slices shown (2 of 2)]
[im 1/26]
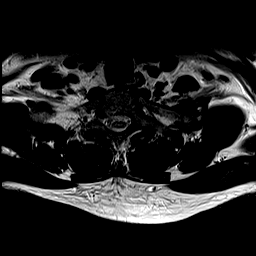
[im 5/26]
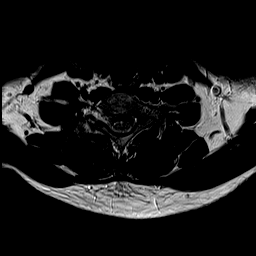
[im 7/26]
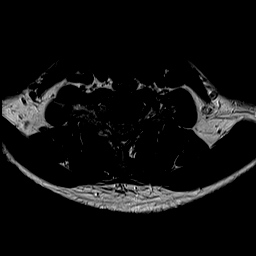
[im 12/26]
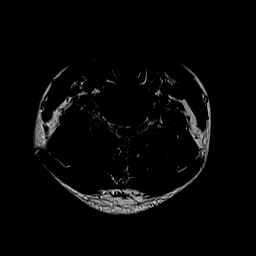
[im 14/26]
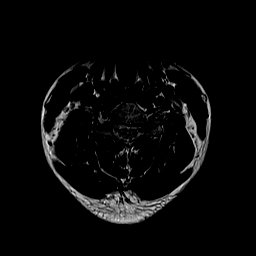
[im 19/26]
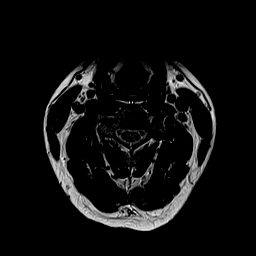
[im 21/26]
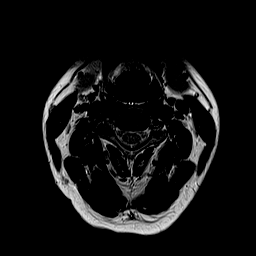
[im 23/26]
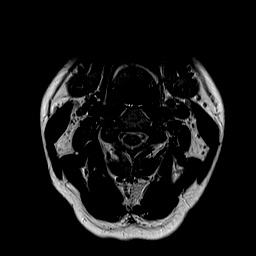
[im 26/26]
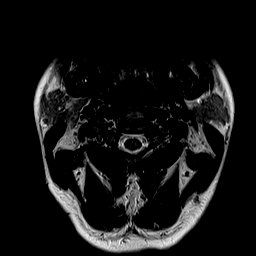

[Series 6: GRE · axial · 3.0mm · 0.35mm/px · z∈[-24,-9]mm · 2 of 26 slices shown]
[im 1/26]
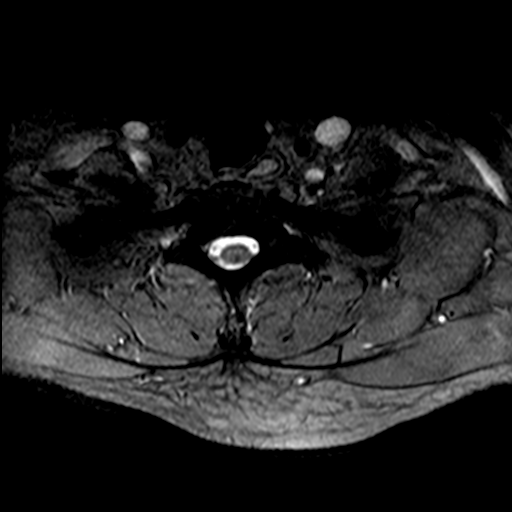
[im 5/26]
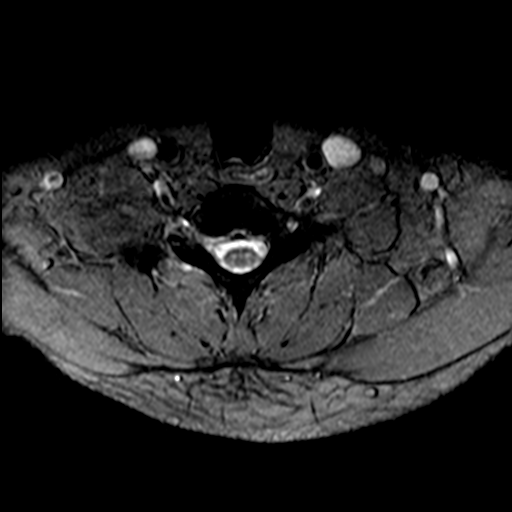

[35 of 48 positions shown; findings below may reference images not displayed]

FINDINGS: Alignment: Mild cervical levocurvature. Mild reversal of the
expected cervical lordosis. No significant spondylolisthesis.

Vertebrae: Vertebral body height is maintained. No significant
marrow edema or focal suspicious osseous lesion.

Cord: No spinal cord signal abnormality is identified.

Posterior Fossa, vertebral arteries, paraspinal tissues: No
abnormality identified within included portions of the posterior
fossa. Flow voids preserved within the imaged cervical vertebral
arteries. Paraspinal soft tissues unremarkable.

Disc levels:

No more than mild disc degeneration at any level.

C2-C3: No significant disc herniation or stenosis.

C3-C4: Tiny central disc protrusion. The disc protrusion focally
effaces the ventral thecal sac, with mild relative spinal canal
narrowing, but without spinal cord mass effect. No significant
foraminal stenosis.

C4-C5: Small central disc protrusion. The disc protrusion focally
effaces the ventral thecal sac, with mild relative spinal canal
narrowing, but without spinal cord mass effect. No significant
foraminal stenosis.

C5-C6: Small central disc protrusion. The disc protrusion focally
effaces the ventral thecal sac, with mild relative spinal canal
narrowing, but without spinal cord mass effect. No significant
foraminal stenosis.

C6-C7: Disc bulge. Superimposed central to right foraminal
broad-based disc extrusion. The disc extrusion contributes to mild
spinal canal stenosis with possible contact upon the ventral spinal
cord. The disc extrusion severely narrows the right foraminal entry
zone, and likely affects the exiting right C7 nerve root. No
significant left foraminal stenosis.

C7-T1: No significant disc herniation or stenosis.
IMPRESSION: Cervical spondylosis, as outlined and with findings most notably as
follows.

At C6-C7, there is a disc bulge. Superimposed central to right
foraminal broad-based disc extrusion. The disc extrusion contributes
to mild spinal canal stenosis with possible contact upon the ventral
spinal cord. The disc extrusion severely narrows the right foraminal
entry zone, and likely affects the exiting right C7 nerve root.

Small central disc protrusions result in mild relative spinal canal
narrowing at C3-C4, C4-C5 and C5-C6 (without spinal cord mass
effect).

Mild nonspecific reversal of the expected cervical lordosis.

Mild cervical levocurvature.

## 2021-08-22 DIAGNOSIS — Z3189 Encounter for other procreative management: Secondary | ICD-10-CM | POA: Diagnosis not present

## 2021-08-28 ENCOUNTER — Other Ambulatory Visit: Payer: Self-pay | Admitting: Orthopedic Surgery

## 2021-08-28 DIAGNOSIS — M79601 Pain in right arm: Secondary | ICD-10-CM

## 2021-08-29 DIAGNOSIS — S4410XD Injury of median nerve at upper arm level, unspecified arm, subsequent encounter: Secondary | ICD-10-CM | POA: Diagnosis not present

## 2021-08-29 DIAGNOSIS — M6281 Muscle weakness (generalized): Secondary | ICD-10-CM | POA: Diagnosis not present

## 2021-08-29 DIAGNOSIS — M5412 Radiculopathy, cervical region: Secondary | ICD-10-CM | POA: Diagnosis not present

## 2021-08-29 DIAGNOSIS — R293 Abnormal posture: Secondary | ICD-10-CM | POA: Diagnosis not present

## 2021-08-29 DIAGNOSIS — M2560 Stiffness of unspecified joint, not elsewhere classified: Secondary | ICD-10-CM | POA: Diagnosis not present

## 2021-08-29 DIAGNOSIS — M79601 Pain in right arm: Secondary | ICD-10-CM | POA: Diagnosis not present

## 2021-09-05 ENCOUNTER — Other Ambulatory Visit: Payer: Self-pay

## 2021-09-05 ENCOUNTER — Ambulatory Visit
Admission: RE | Admit: 2021-09-05 | Discharge: 2021-09-05 | Disposition: A | Discharge status: Home / Self Care | Payer: 59 | Source: Ambulatory Visit | Attending: Orthopedic Surgery | Admitting: Orthopedic Surgery

## 2021-09-05 DIAGNOSIS — M79601 Pain in right arm: Secondary | ICD-10-CM

## 2021-09-05 DIAGNOSIS — M50223 Other cervical disc displacement at C6-C7 level: Secondary | ICD-10-CM | POA: Diagnosis not present

## 2021-09-05 DIAGNOSIS — M47812 Spondylosis without myelopathy or radiculopathy, cervical region: Secondary | ICD-10-CM | POA: Diagnosis not present

## 2021-09-05 IMAGING — XA DG INJECT/[PERSON_NAME] INC NEEDLE/CATH/PLC EPI/CERV/THOR W/IMG
2 series · 2 of 2 positions shown · non-contrast
Comparison: none

CLINICAL DATA: Cervical spondylosis without myelopathy. Right
shoulder and arm pain with numbness. Disc extrusion at C6-7.

[Series 1: ortho standard · 1 of 1 slices shown (1 of 2)]
[im 1/1]
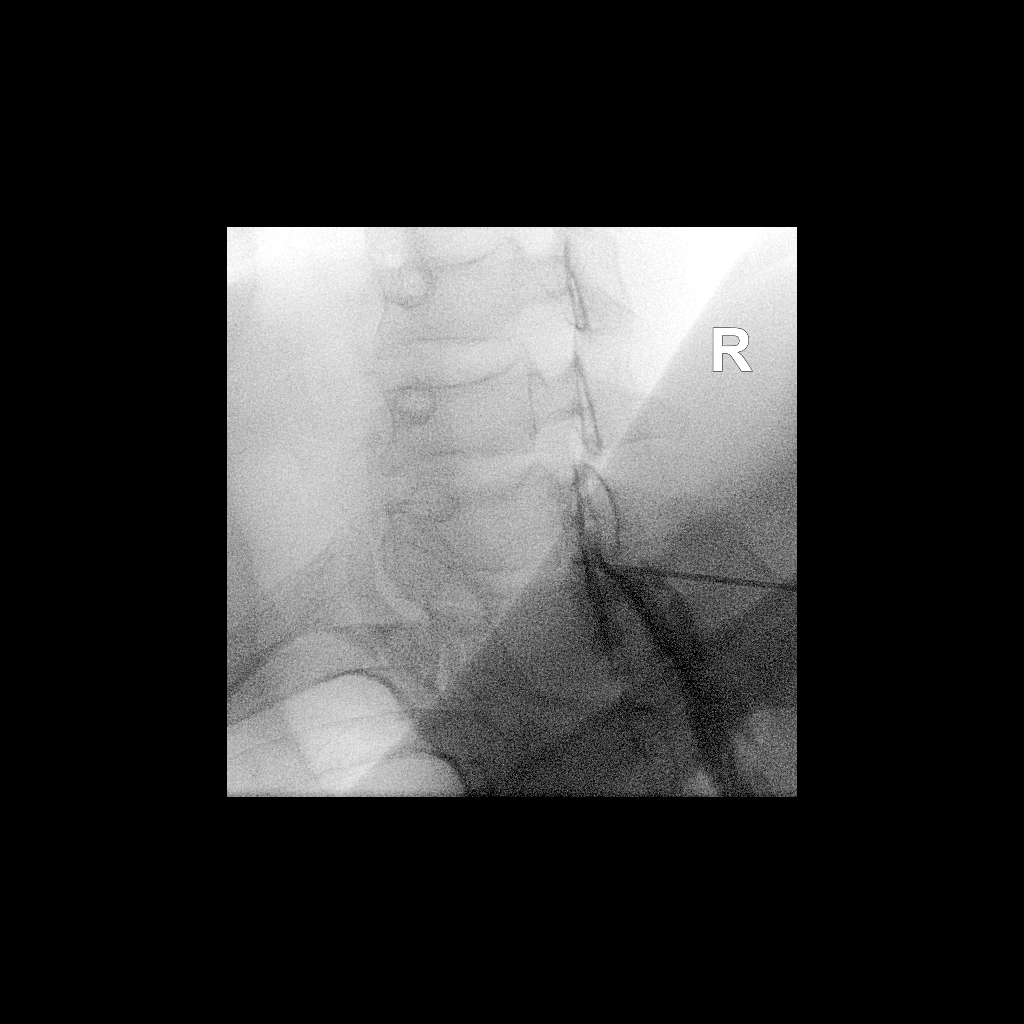

[Series 2: ortho standard · 1 of 1 slices shown (2 of 2)]
[im 1/1]
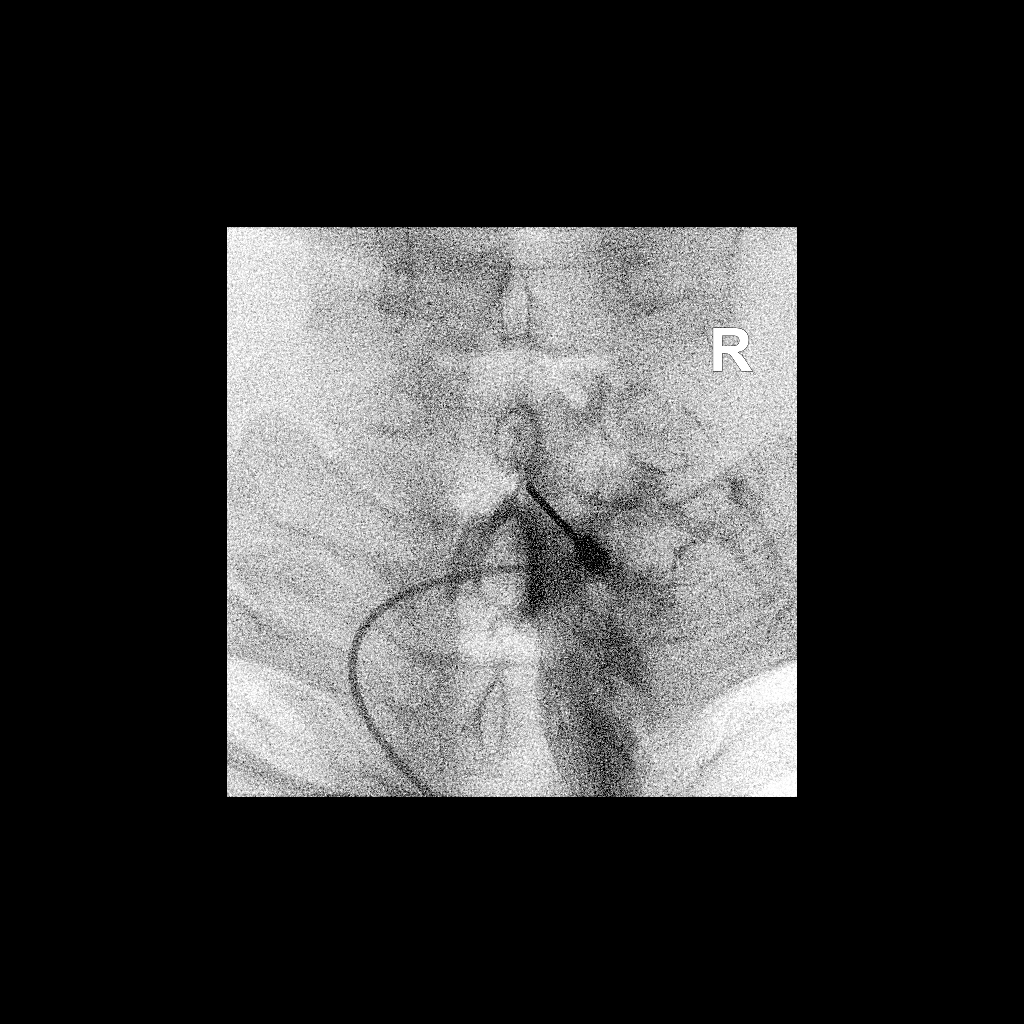

[2 of 2 positions shown; findings below may reference images not displayed]

FLUOROSCOPY TIME:  Fluoroscopy Time: 37 seconds

Radiation Exposure Index: 13.53 microGray*m^2

PROCEDURE:
The procedure, risks, benefits, and alternatives were explained to
the patient. Questions regarding the procedure were encouraged and
answered. The patient understands and consents to the procedure.

CERVICAL EPIDURAL INJECTION

An interlaminar approach was performed on the right at C7-T1. A
inch 20 gauge epidural needle was advanced using loss-of-resistance
technique.

DIAGNOSTIC EPIDURAL INJECTION

Injection of Isovue-M 300 initially demonstrated spread dorsal to
the ligament. The needle was advanced until a second loss of
resistance was obtained, and contrast injection at this location
demonstrated a good epidural pattern with spread above and below the
level of needle placement, primarily on the right. No vascular
opacification is seen.

THERAPEUTIC EPIDURAL INJECTION

1.5 ml of Kenalog 40 mixed with 2 ml of normal saline were then
instilled. The procedure was well-tolerated, and the patient was
discharged thirty minutes following the injection in good condition.
IMPRESSION: Technically successful interlaminar epidural injection on the right
at C7-T1.

## 2021-09-05 MED ORDER — TRIAMCINOLONE ACETONIDE 40 MG/ML IJ SUSP (RADIOLOGY)
60.0000 mg | Freq: Once | INTRAMUSCULAR | Status: AC
  Administered 2021-09-05: 60 mg via EPIDURAL

## 2021-09-05 MED ORDER — IOPAMIDOL (ISOVUE-M 300) INJECTION 61%
1.0000 mL | Freq: Once | INTRAMUSCULAR | Status: AC
  Administered 2021-09-05: 1 mL via EPIDURAL

## 2021-09-05 NOTE — Discharge Instructions (Signed)

## 2021-09-10 DIAGNOSIS — M79601 Pain in right arm: Secondary | ICD-10-CM | POA: Diagnosis not present

## 2021-09-10 DIAGNOSIS — M6281 Muscle weakness (generalized): Secondary | ICD-10-CM | POA: Diagnosis not present

## 2021-09-10 DIAGNOSIS — S4410XD Injury of median nerve at upper arm level, unspecified arm, subsequent encounter: Secondary | ICD-10-CM | POA: Diagnosis not present

## 2021-09-10 DIAGNOSIS — M2560 Stiffness of unspecified joint, not elsewhere classified: Secondary | ICD-10-CM | POA: Diagnosis not present

## 2021-09-10 DIAGNOSIS — R293 Abnormal posture: Secondary | ICD-10-CM | POA: Diagnosis not present

## 2021-09-10 DIAGNOSIS — M5412 Radiculopathy, cervical region: Secondary | ICD-10-CM | POA: Diagnosis not present

## 2021-09-12 DIAGNOSIS — M6281 Muscle weakness (generalized): Secondary | ICD-10-CM | POA: Diagnosis not present

## 2021-09-12 DIAGNOSIS — M2560 Stiffness of unspecified joint, not elsewhere classified: Secondary | ICD-10-CM | POA: Diagnosis not present

## 2021-09-12 DIAGNOSIS — M5412 Radiculopathy, cervical region: Secondary | ICD-10-CM | POA: Diagnosis not present

## 2021-09-12 DIAGNOSIS — S4410XD Injury of median nerve at upper arm level, unspecified arm, subsequent encounter: Secondary | ICD-10-CM | POA: Diagnosis not present

## 2021-09-12 DIAGNOSIS — M79601 Pain in right arm: Secondary | ICD-10-CM | POA: Diagnosis not present

## 2021-09-12 DIAGNOSIS — R293 Abnormal posture: Secondary | ICD-10-CM | POA: Diagnosis not present

## 2021-09-17 DIAGNOSIS — R293 Abnormal posture: Secondary | ICD-10-CM | POA: Diagnosis not present

## 2021-09-17 DIAGNOSIS — S4410XD Injury of median nerve at upper arm level, unspecified arm, subsequent encounter: Secondary | ICD-10-CM | POA: Diagnosis not present

## 2021-09-17 DIAGNOSIS — M2560 Stiffness of unspecified joint, not elsewhere classified: Secondary | ICD-10-CM | POA: Diagnosis not present

## 2021-09-17 DIAGNOSIS — M79601 Pain in right arm: Secondary | ICD-10-CM | POA: Diagnosis not present

## 2021-09-17 DIAGNOSIS — M5412 Radiculopathy, cervical region: Secondary | ICD-10-CM | POA: Diagnosis not present

## 2021-09-17 DIAGNOSIS — M6281 Muscle weakness (generalized): Secondary | ICD-10-CM | POA: Diagnosis not present

## 2021-09-19 DIAGNOSIS — M5412 Radiculopathy, cervical region: Secondary | ICD-10-CM | POA: Diagnosis not present

## 2021-09-19 DIAGNOSIS — R293 Abnormal posture: Secondary | ICD-10-CM | POA: Diagnosis not present

## 2021-09-19 DIAGNOSIS — M6281 Muscle weakness (generalized): Secondary | ICD-10-CM | POA: Diagnosis not present

## 2021-09-19 DIAGNOSIS — M79601 Pain in right arm: Secondary | ICD-10-CM | POA: Diagnosis not present

## 2021-09-19 DIAGNOSIS — S4410XD Injury of median nerve at upper arm level, unspecified arm, subsequent encounter: Secondary | ICD-10-CM | POA: Diagnosis not present

## 2021-09-19 DIAGNOSIS — M2560 Stiffness of unspecified joint, not elsewhere classified: Secondary | ICD-10-CM | POA: Diagnosis not present

## 2021-09-20 DIAGNOSIS — Z3189 Encounter for other procreative management: Secondary | ICD-10-CM | POA: Diagnosis not present

## 2021-09-26 DIAGNOSIS — R293 Abnormal posture: Secondary | ICD-10-CM | POA: Diagnosis not present

## 2021-09-26 DIAGNOSIS — M6281 Muscle weakness (generalized): Secondary | ICD-10-CM | POA: Diagnosis not present

## 2021-09-26 DIAGNOSIS — M79601 Pain in right arm: Secondary | ICD-10-CM | POA: Diagnosis not present

## 2021-09-26 DIAGNOSIS — M5412 Radiculopathy, cervical region: Secondary | ICD-10-CM | POA: Diagnosis not present

## 2021-09-26 DIAGNOSIS — M2560 Stiffness of unspecified joint, not elsewhere classified: Secondary | ICD-10-CM | POA: Diagnosis not present

## 2021-09-26 DIAGNOSIS — S4410XD Injury of median nerve at upper arm level, unspecified arm, subsequent encounter: Secondary | ICD-10-CM | POA: Diagnosis not present

## 2021-10-10 DIAGNOSIS — R293 Abnormal posture: Secondary | ICD-10-CM | POA: Diagnosis not present

## 2021-10-10 DIAGNOSIS — S4410XD Injury of median nerve at upper arm level, unspecified arm, subsequent encounter: Secondary | ICD-10-CM | POA: Diagnosis not present

## 2021-10-10 DIAGNOSIS — M2560 Stiffness of unspecified joint, not elsewhere classified: Secondary | ICD-10-CM | POA: Diagnosis not present

## 2021-10-10 DIAGNOSIS — M5412 Radiculopathy, cervical region: Secondary | ICD-10-CM | POA: Diagnosis not present

## 2021-10-10 DIAGNOSIS — M6281 Muscle weakness (generalized): Secondary | ICD-10-CM | POA: Diagnosis not present

## 2021-10-10 DIAGNOSIS — M79601 Pain in right arm: Secondary | ICD-10-CM | POA: Diagnosis not present

## 2021-10-17 DIAGNOSIS — M5412 Radiculopathy, cervical region: Secondary | ICD-10-CM | POA: Diagnosis not present

## 2021-10-17 DIAGNOSIS — M2560 Stiffness of unspecified joint, not elsewhere classified: Secondary | ICD-10-CM | POA: Diagnosis not present

## 2021-10-17 DIAGNOSIS — M6281 Muscle weakness (generalized): Secondary | ICD-10-CM | POA: Diagnosis not present

## 2021-10-17 DIAGNOSIS — M79601 Pain in right arm: Secondary | ICD-10-CM | POA: Diagnosis not present

## 2021-10-17 DIAGNOSIS — R293 Abnormal posture: Secondary | ICD-10-CM | POA: Diagnosis not present

## 2021-10-17 DIAGNOSIS — S4410XD Injury of median nerve at upper arm level, unspecified arm, subsequent encounter: Secondary | ICD-10-CM | POA: Diagnosis not present

## 2022-07-31 ENCOUNTER — Ambulatory Visit (INDEPENDENT_AMBULATORY_CARE_PROVIDER_SITE_OTHER): Payer: No Typology Code available for payment source | Admitting: Family Medicine

## 2022-07-31 ENCOUNTER — Encounter: Payer: Self-pay | Admitting: Family Medicine

## 2022-07-31 VITALS — BP 112/80 | HR 65 | Temp 98.0°F | Ht 65.25 in | Wt 161.4 lb

## 2022-07-31 DIAGNOSIS — M5412 Radiculopathy, cervical region: Secondary | ICD-10-CM | POA: Diagnosis not present

## 2022-07-31 DIAGNOSIS — Z114 Encounter for screening for human immunodeficiency virus [HIV]: Secondary | ICD-10-CM | POA: Diagnosis not present

## 2022-07-31 DIAGNOSIS — Z1159 Encounter for screening for other viral diseases: Secondary | ICD-10-CM

## 2022-07-31 DIAGNOSIS — Z23 Encounter for immunization: Secondary | ICD-10-CM

## 2022-07-31 DIAGNOSIS — Z0000 Encounter for general adult medical examination without abnormal findings: Secondary | ICD-10-CM

## 2022-07-31 NOTE — Progress Notes (Signed)
New Patient Office Visit  Subjective    Patient ID: Tyler Daniels, male    DOB: [birth year]  Age: 36 y.o. MRN: [id number]  CC:  Chief Complaint  Patient presents with   Establish Care    HPI Tyler Daniels presents to establish care Patient reports he used to see [facility], hasn't seen them recently. Patient reports e has a history of herniated cervical disc and had injection by Dr. Carola Frost last year, had 95% improvement in his symptoms, states that occasionally he will have some symptoms but not to the extent prior to the injection.   We reviewed his past social, family history and I reviewed his immunization history. Patient is expecting a new baby in a few weeks and I recommended we update his TdaP and get him a flu shot today.  No outpatient encounter medications on file as of 07/31/2022.   No facility-administered encounter medications on file as of 07/31/2022.    Past Medical History:  Diagnosis Date   Cervical herniated disc    History of chickenpox     History reviewed. No pertinent surgical history.  History reviewed. No pertinent family history.  Social History   Socioeconomic History   Marital status: Married    Spouse name: Not on file   Number of children: Not on file   Years of education: Not on file   Highest education level: Not on file  Occupational History   Not on file  Tobacco Use   Smoking status: Former    Types: Cigarettes   Smokeless tobacco: Former  Building services engineer Use: Never used  Substance and Sexual Activity   Alcohol use: Not Currently   Drug use: Not Currently    Comment: history of marijuana use   Sexual activity: Yes  Other Topics Concern   Not on file  Social History Narrative   Not on file   Social Determinants of Health   Financial Resource Strain: Not on file  Food Insecurity: Not on file  Transportation Needs: Not on file  Physical Activity: Not on file  Stress: Not on file  Social Connections: Not on file   Intimate Partner Violence: Not on file    Review of Systems  Constitutional:  Negative for chills, fever and weight loss.  HENT:  Negative for hearing loss.   Eyes:  Negative for blurred vision.  Respiratory:  Negative for cough and shortness of breath.   Cardiovascular:  Negative for chest pain and palpitations.  Gastrointestinal:  Negative for constipation, diarrhea and heartburn.  Musculoskeletal:  Negative for joint pain.  Neurological:  Negative for dizziness.  All other systems reviewed and are negative.       Objective    BP 112/80 (BP Location: Left Arm, Patient Position: Sitting, Cuff Size: Normal)   Pulse 65   Temp 98 F (36.7 C) (Oral)   Ht 5' 5.25" (1.657 m)   Wt 161 lb 6.4 oz (73.2 kg)   SpO2 98%   BMI 26.65 kg/m   Physical Exam Vitals reviewed.  Constitutional:      Appearance: Normal appearance. He is normal weight.  HENT:     Right Ear: Tympanic membrane and ear canal normal.     Left Ear: Tympanic membrane and ear canal normal.  Eyes:     Extraocular Movements: Extraocular movements intact.     Pupils: Pupils are equal, round, and reactive to light.  Cardiovascular:     Rate and Rhythm: Normal rate and regular  rhythm.     Pulses: Normal pulses.  Pulmonary:     Effort: Pulmonary effort is normal.     Breath sounds: Normal breath sounds. No wheezing or rhonchi.  Abdominal:     General: Abdomen is flat. Bowel sounds are normal.     Palpations: Abdomen is soft.  Musculoskeletal:        General: Normal range of motion.  Skin:    General: Skin is warm and dry.  Neurological:     General: No focal deficit present.     Mental Status: He is alert and oriented to person, place, and time. Mental status is at baseline.  Psychiatric:        Mood and Affect: Mood normal.        Behavior: Behavior normal.        Thought Content: Thought content normal.         Assessment & Plan:   Problem List Items Addressed This Visit       Nervous and  Auditory   Cervical radiculopathy at C7     Other   Encounter for routine history and physical exam for male    Normal physical exam findings today, Patient was given verbal and written anticipatory guidance and he will get his immunizations up to date today. Follow up yearly for annual exams.      Other Visit Diagnoses     Immunization due    -  Primary   Relevant Orders   Tdap vaccine greater than or equal to 7yo IM (Completed)   Flu Vaccine QUAD 6+ mos PF IM (Fluarix Quad PF) (Completed)   Encounter for screening for HIV       Relevant Orders   HIV antibody (with reflex)   Encounter for hepatitis C screening test for low risk patient       Relevant Orders   Hep C Antibody       Return in about 1 year (around 08/01/2023) for annual physical exam.   Karie Georges, MD

## 2022-07-31 NOTE — Assessment & Plan Note (Signed)
Normal physical exam findings today, Patient was given verbal and written anticipatory guidance and he will get his immunizations up to date today. Follow up yearly for annual exams.

## 2022-08-01 LAB — HEPATITIS C ANTIBODY: Hepatitis C Ab: NONREACTIVE

## 2022-08-01 LAB — HIV ANTIBODY (ROUTINE TESTING W REFLEX): HIV 1&2 Ab, 4th Generation: NONREACTIVE

## 2022-08-01 NOTE — Progress Notes (Signed)
Negative screenings

## 2023-03-05 DIAGNOSIS — H52223 Regular astigmatism, bilateral: Secondary | ICD-10-CM | POA: Diagnosis not present

## 2023-07-02 ENCOUNTER — Encounter: Payer: Self-pay | Admitting: Family Medicine

## 2023-07-02 ENCOUNTER — Ambulatory Visit (INDEPENDENT_AMBULATORY_CARE_PROVIDER_SITE_OTHER): Payer: 59 | Admitting: Family Medicine

## 2023-07-02 VITALS — BP 100/60 | HR 59 | Temp 98.3°F | Ht 65.0 in | Wt 148.8 lb

## 2023-07-02 DIAGNOSIS — Z13220 Encounter for screening for lipoid disorders: Secondary | ICD-10-CM | POA: Diagnosis not present

## 2023-07-02 DIAGNOSIS — Z0000 Encounter for general adult medical examination without abnormal findings: Secondary | ICD-10-CM | POA: Diagnosis not present

## 2023-07-02 LAB — CBC WITH DIFFERENTIAL/PLATELET
Basophils Absolute: 0 10*3/uL (ref 0.0–0.1)
Basophils Relative: 0.5 % (ref 0.0–3.0)
Eosinophils Absolute: 0.2 10*3/uL (ref 0.0–0.7)
Eosinophils Relative: 3.5 % (ref 0.0–5.0)
HCT: 46.8 % (ref 39.0–52.0)
Hemoglobin: 15.5 g/dL (ref 13.0–17.0)
Lymphocytes Relative: 34 % (ref 12.0–46.0)
Lymphs Abs: 1.5 10*3/uL (ref 0.7–4.0)
MCHC: 33 g/dL (ref 30.0–36.0)
MCV: 93.4 fl (ref 78.0–100.0)
Monocytes Absolute: 0.3 10*3/uL (ref 0.1–1.0)
Monocytes Relative: 7.5 % (ref 3.0–12.0)
Neutro Abs: 2.4 10*3/uL (ref 1.4–7.7)
Neutrophils Relative %: 54.5 % (ref 43.0–77.0)
Platelets: 284 10*3/uL (ref 150.0–400.0)
RBC: 5.01 Mil/uL (ref 4.22–5.81)
RDW: 13.3 % (ref 11.5–15.5)
WBC: 4.4 10*3/uL (ref 4.0–10.5)

## 2023-07-02 LAB — COMPREHENSIVE METABOLIC PANEL
ALT: 11 U/L (ref 0–53)
AST: 17 U/L (ref 0–37)
Albumin: 4.7 g/dL (ref 3.5–5.2)
Alkaline Phosphatase: 78 U/L (ref 39–117)
BUN: 15 mg/dL (ref 6–23)
CO2: 29 mEq/L (ref 19–32)
Calcium: 10 mg/dL (ref 8.4–10.5)
Chloride: 101 mEq/L (ref 96–112)
Creatinine, Ser: 0.98 mg/dL (ref 0.40–1.50)
GFR: 97.52 mL/min (ref >60.00)
Glucose, Bld: 92 mg/dL (ref 70–99)
Potassium: 4.7 mEq/L (ref 3.5–5.1)
Sodium: 138 mEq/L (ref 135–145)
Total Bilirubin: 0.8 mg/dL (ref 0.2–1.2)
Total Protein: 7.6 g/dL (ref 6.0–8.3)

## 2023-07-02 LAB — LIPID PANEL
Cholesterol: 201 mg/dL — ABNORMAL HIGH (ref 0–200)
HDL: 51.2 mg/dL (ref >39.00)
LDL Cholesterol: 127 mg/dL — ABNORMAL HIGH (ref 0–99)
NonHDL: 150.22
Total CHOL/HDL Ratio: 4
Triglycerides: 117 mg/dL (ref 0.0–149.0)
VLDL: 23.4 mg/dL (ref 0.0–40.0)

## 2023-07-02 NOTE — Patient Instructions (Signed)
Health Maintenance, Male Adopting a healthy lifestyle and getting preventive care are important in promoting health and wellness. Ask your health care provider about: The right schedule for you to have regular tests and exams. Things you can do on your own to prevent diseases and keep yourself healthy. What should I know about diet, weight, and exercise? Eat a healthy diet  Eat a diet that includes plenty of vegetables, fruits, low-fat dairy products, and lean protein. Do not eat a lot of foods that are high in solid fats, added sugars, or sodium. Maintain a healthy weight Body mass index (BMI) is a measurement that can be used to identify possible weight problems. It estimates body fat based on height and weight. Your health care provider can help determine your BMI and help you achieve or maintain a healthy weight. Get regular exercise Get regular exercise. This is one of the most important things you can do for your health. Most adults should: Exercise for at least 150 minutes each week. The exercise should increase your heart rate and make you sweat (moderate-intensity exercise). Do strengthening exercises at least twice a week. This is in addition to the moderate-intensity exercise. Spend less time sitting. Even light physical activity can be beneficial. Watch cholesterol and blood lipids Have your blood tested for lipids and cholesterol at 39 years of age, then have this test every 5 years. You may need to have your cholesterol levels checked more often if: Your lipid or cholesterol levels are high. You are older than 40 years of age. You are at high risk for heart disease. What should I know about cancer screening? Many types of cancers can be detected early and may often be prevented. Depending on your health history and family history, you may need to have cancer screening at various ages. This may include screening for: Colorectal cancer. Prostate cancer. Skin cancer. Lung  cancer. What should I know about heart disease, diabetes, and high blood pressure? Blood pressure and heart disease High blood pressure causes heart disease and increases the risk of stroke. This is more likely to develop in people who have high blood pressure readings or are overweight. Talk with your health care provider about your target blood pressure readings. Have your blood pressure checked: Every 3-5 years if you are 18-39 years of age. Every year if you are 40 years old or older. If you are between the ages of 65 and 75 and are a current or former smoker, ask your health care provider if you should have a one-time screening for abdominal aortic aneurysm (AAA). Diabetes Have regular diabetes screenings. This checks your fasting blood sugar level. Have the screening done: Once every three years after age 45 if you are at a normal weight and have a low risk for diabetes. More often and at a younger age if you are overweight or have a high risk for diabetes. What should I know about preventing infection? Hepatitis B If you have a higher risk for hepatitis B, you should be screened for this virus. Talk with your health care provider to find out if you are at risk for hepatitis B infection. Hepatitis C Blood testing is recommended for: Everyone born from 1945 through 1965. Anyone with known risk factors for hepatitis C. Sexually transmitted infections (STIs) You should be screened each year for STIs, including gonorrhea and chlamydia, if: You are sexually active and are younger than 39 years of age. You are older than 39 years of age and your   health care provider tells you that you are at risk for this type of infection. Your sexual activity has changed since you were last screened, and you are at increased risk for chlamydia or gonorrhea. Ask your health care provider if you are at risk. Ask your health care provider about whether you are at high risk for HIV. Your health care provider  may recommend a prescription medicine to help prevent HIV infection. If you choose to take medicine to prevent HIV, you should first get tested for HIV. You should then be tested every 3 months for as long as you are taking the medicine. Follow these instructions at home: Alcohol use Do not drink alcohol if your health care provider tells you not to drink. If you drink alcohol: Limit how much you have to 0-2 drinks a day. Know how much alcohol is in your drink. In the [place] one drink equals one 12 oz bottle of beer (355 mL), one 5 oz glass of wine (148 mL), or one 1 oz glass of hard liquor (44 mL). Lifestyle Do not use any products that contain nicotine or tobacco. These products include cigarettes, chewing tobacco, and vaping devices, such as e-cigarettes. If you need help quitting, ask your health care provider. Do not use street drugs. Do not share needles. Ask your health care provider for help if you need support or information about quitting drugs. General instructions Schedule regular health, dental, and eye exams. Stay current with your vaccines. Tell your health care provider if: You often feel depressed. You have ever been abused or do not feel safe at home. Summary Adopting a healthy lifestyle and getting preventive care are important in promoting health and wellness. Follow your health care provider's instructions about healthy diet, exercising, and getting tested or screened for diseases. Follow your health care provider's instructions on monitoring your cholesterol and blood pressure. This information is not intended to replace advice given to you by your health care provider. Make sure you discuss any questions you have with your health care provider. Document Revised: 04/16/2021 Document Reviewed: 04/16/2021 Elsevier Patient Education  2024 [employer]  

## 2023-07-02 NOTE — Progress Notes (Signed)
Complete physical exam  Patient: Tyler Daniels   DOB: 12-Nov-1986   37 y.o. Male  MRN: [id number]  Subjective:    Chief Complaint  Patient presents with   Annual Exam    Arlis Yale is a 37 y.o. male who presents today for a complete physical exam. He reports consuming a general diet. Home exercise routine includes plsy pickleball 2-3 days per week. He generally feels well. He reports sleeping patient has a 57 month old infant at home, states that he is a night owl and stays up late, reports that he usually feels rested, getting about 6 hours of sleep. He does not have additional problems to discuss today.    Most recent fall risk assessment:    03/20/2016   12:54 PM  Fall Risk   Falls in the past year? No     Most recent depression screenings:    07/02/2023   11:14 AM 03/20/2016   12:54 PM  PHQ 2/9 Scores  PHQ - 2 Score 0 0    Vision:Within last year and Dental: No current dental problems and Receives regular dental care  Patient Active Problem List   Diagnosis Date Noted   Cervical radiculopathy at C7 07/31/2022   Encounter for routine history and physical exam for male 07/31/2022      Patient Care Team: Karie Georges, MD as PCP - General (Family Medicine)   No outpatient medications prior to visit.   No facility-administered medications prior to visit.    Review of Systems  HENT:  Negative for hearing loss.   Eyes:  Negative for blurred vision.  Respiratory:  Negative for shortness of breath.   Cardiovascular:  Negative for chest pain.  Gastrointestinal: Negative.   Genitourinary: Negative.   Musculoskeletal:  Negative for back pain.  Neurological:  Negative for headaches.  Psychiatric/Behavioral:  Negative for depression.   All other systems reviewed and are negative.      Objective:     BP 100/60 (BP Location: Left Arm, Patient Position: Sitting, Cuff Size: Normal)   Pulse (!) 59   Temp 98.3 F (36.8 C) (Oral)   Ht 5\' 5"  (1.651 m)   Wt 148  lb 12.8 oz (67.5 kg)   SpO2 100%   BMI 24.76 kg/m    Physical Exam Vitals reviewed.  Constitutional:      Appearance: Normal appearance. He is well-groomed and normal weight.  HENT:     Right Ear: Tympanic membrane and ear canal normal.     Left Ear: Tympanic membrane and ear canal normal.     Mouth/Throat:     Mouth: Mucous membranes are moist.     Pharynx: No posterior oropharyngeal erythema.  Eyes:     Extraocular Movements: Extraocular movements intact.     Conjunctiva/sclera: Conjunctivae normal.  Neck:     Thyroid: No thyromegaly.  Cardiovascular:     Rate and Rhythm: Normal rate and regular rhythm.     Heart sounds: S1 normal and S2 normal. No murmur heard. Pulmonary:     Effort: Pulmonary effort is normal.     Breath sounds: Normal breath sounds and air entry. No rales.  Abdominal:     General: Abdomen is flat. Bowel sounds are normal.  Musculoskeletal:     Right lower leg: No edema.     Left lower leg: No edema.  Lymphadenopathy:     Cervical: No cervical adenopathy.  Neurological:     General: No focal deficit present.  Mental Status: He is alert and oriented to person, place, and time.     Gait: Gait is intact.  Psychiatric:        Mood and Affect: Mood and affect normal.      No results found for any visits on 07/02/23.     Assessment & Plan:    Routine Health Maintenance and Physical Exam  Immunization History  Administered Date(s) Administered   Influenza,inj,Quad PF,6+ Mos 07/31/2022   Tdap 07/31/2022    Health Maintenance  Topic Date Due   COVID-19 Vaccine (1 - 2023-24 season) Never done   INFLUENZA VACCINE  07/10/2023   DTaP/Tdap/Td (2 - Td or Tdap) 07/31/2032   Hepatitis C Screening  Completed   HIV Screening  Completed   HPV VACCINES  Aged Out    Discussed health benefits of physical activity, and encouraged him to engage in regular exercise appropriate for his age and condition.  Routine adult health maintenance -      Comprehensive metabolic panel -     CBC with Differential/Platelet  Lipid screening -     Lipid panel; Future  Routine general medical examination at a health care facility   Normal physical exam findings today, ordering surveillance labs. Counseled patient on healthy sleep habits/ hygiene. Handouts given on healthy eating and exercise. RTC 1 year for annual physical or PRN. Return in 1 year (on 07/01/2024).     Karie Georges, MD

## 2023-07-03 ENCOUNTER — Encounter: Payer: Self-pay | Admitting: Family Medicine

## 2023-11-13 DIAGNOSIS — M79672 Pain in left foot: Secondary | ICD-10-CM | POA: Diagnosis not present

## 2023-11-14 ENCOUNTER — Ambulatory Visit: Payer: 59 | Admitting: Adult Health

## 2023-12-17 DIAGNOSIS — S92322D Displaced fracture of second metatarsal bone, left foot, subsequent encounter for fracture with routine healing: Secondary | ICD-10-CM | POA: Diagnosis not present

## 2024-01-14 DIAGNOSIS — S92322D Displaced fracture of second metatarsal bone, left foot, subsequent encounter for fracture with routine healing: Secondary | ICD-10-CM | POA: Diagnosis not present

## 2024-08-03 ENCOUNTER — Encounter: Payer: Self-pay | Admitting: Family Medicine

## 2024-08-03 ENCOUNTER — Ambulatory Visit (INDEPENDENT_AMBULATORY_CARE_PROVIDER_SITE_OTHER): Admitting: Family Medicine

## 2024-08-03 VITALS — BP 100/68 | HR 60 | Temp 98.3°F | Ht 65.0 in | Wt 152.1 lb

## 2024-08-03 DIAGNOSIS — Z0000 Encounter for general adult medical examination without abnormal findings: Secondary | ICD-10-CM | POA: Diagnosis not present

## 2024-08-03 DIAGNOSIS — Z13220 Encounter for screening for lipoid disorders: Secondary | ICD-10-CM

## 2024-08-03 NOTE — Progress Notes (Signed)
 Complete physical exam  Patient: Tyler Daniels   DOB: 04/02/1986   38 y.o. Male  MRN: [id number]  Subjective:    Chief Complaint  Patient presents with   Annual Exam    Tyler Daniels is a 38 y.o. male who presents today for a complete physical exam. He reports consuming a general diet. Patient usually eats a sausage egg and cheese, sometimes an orange, sometimes pizza and fries at lunch, dinner is usually a protein a, starch and a veggie. Pt reports at work he doesn't ever have time  plays pickleball once a week, tries to do twice a week. He generally feels well. He reports sleeping somewhat poorly, has trouble falling asleep at night, has a small child at home and so he tries to catch up on work. He does not have additional problems to discuss today.    Most recent fall risk assessment:    03/20/2016   12:54 PM  Fall Risk   Falls in the past year? No      Data saved with a previous flowsheet row definition     Most recent depression screenings:    08/03/2024    3:58 PM 07/02/2023   11:14 AM  PHQ 2/9 Scores  PHQ - 2 Score 0 0  PHQ- 9 Score 0     Vision:Within last year and had LASIK surgery in january and Dental: No current dental problems and Receives regular dental care  Patient Active Problem List   Diagnosis Date Noted   Cervical radiculopathy at C7 07/31/2022   Encounter for routine history and physical exam for male 07/31/2022      Patient Care Team: Tyler Heron HERO, MD as PCP - General (Family Medicine)   No outpatient medications prior to visit.   No facility-administered medications prior to visit.    Review of Systems  HENT:  Negative for hearing loss.   Eyes:  Negative for blurred vision.  Respiratory:  Negative for shortness of breath.   Cardiovascular:  Negative for chest pain.  Gastrointestinal: Negative.   Genitourinary: Negative.   Musculoskeletal:  Negative for back pain.  Neurological:  Negative for headaches.  Psychiatric/Behavioral:   Negative for depression.        Objective:     BP 100/68   Pulse 60   Temp 98.3 F (36.8 C) (Oral)   Ht 5' 5 (1.651 m)   Wt 152 lb 1.6 oz (69 kg)   SpO2 98%   BMI 25.31 kg/m    Physical Exam Vitals reviewed.  Constitutional:      Appearance: Normal appearance. He is well-groomed and normal weight.  HENT:     Right Ear: Tympanic membrane and ear canal normal.     Left Ear: Tympanic membrane and ear canal normal.     Mouth/Throat:     Mouth: Mucous membranes are moist.     Pharynx: No posterior oropharyngeal erythema.  Eyes:     Extraocular Movements: Extraocular movements intact.     Conjunctiva/sclera: Conjunctivae normal.  Neck:     Thyroid: No thyromegaly.  Cardiovascular:     Rate and Rhythm: Normal rate and regular rhythm.     Heart sounds: S1 normal and S2 normal. No murmur heard. Pulmonary:     Effort: Pulmonary effort is normal.     Breath sounds: Normal breath sounds and air entry. No rales.  Abdominal:     General: Abdomen is flat. Bowel sounds are normal.  Musculoskeletal:  Right lower leg: No edema.     Left lower leg: No edema.  Lymphadenopathy:     Cervical: No cervical adenopathy.  Neurological:     General: No focal deficit present.     Mental Status: He is alert and oriented to person, place, and time.     Gait: Gait is intact.  Psychiatric:        Mood and Affect: Mood and affect normal.     No results found for any visits on 08/03/24.     Assessment & Plan:    Routine Health Maintenance and Physical Exam  Immunization History  Administered Date(s) Administered   DTP 10/06/1984, 12/15/1984, 02/25/1985, 03/01/1986, 07/17/1988   Hepatitis B, PED/ADOLESCENT 08/08/1989, 12/18/1991, 01/09/1995   Influenza,inj,Quad PF,6+ Mos 07/31/2022   MMR 11/17/1985, 08/09/1993   OPV 10/06/1984, 12/15/1984, 02/25/1985, 03/01/1986, 07/17/1988   Tdap 06/07/2013, 07/31/2022    Health Maintenance  Topic Date Due   HPV VACCINES (1 - 3-dose SCDM  series) Never done   COVID-19 Vaccine (1 - 2024-25 season) Never done   INFLUENZA VACCINE  07/09/2024   DTaP/Tdap/Td (8 - Td or Tdap) 07/31/2032   Hepatitis B Vaccines 19-59 Average Risk  Completed   Hepatitis C Screening  Completed   HIV Screening  Completed   Pneumococcal Vaccine  Aged Out   Meningococcal B Vaccine  Aged Out    Discussed health benefits of physical activity, and encouraged him to engage in regular exercise appropriate for his age and condition.  Lipid screening -     Lipid panel; Future  Routine general medical examination at a health care facility -     Comprehensive metabolic panel with GFR; Future   Normal physical exam findings. I counseled the patient on the recommended amount of exercise per CDC recommendation. I reviewed preventative screening, immunizations, and medical history and updated in the chart, and appropriate labs and vaccinations were ordered. Handouts given on healthy eating and exercise.   Return in 1 year (on 08/03/2025).     Heron CHRISTELLA Sharper, MD

## 2024-08-03 NOTE — Patient Instructions (Signed)
 Health Maintenance, Male  Adopting a healthy lifestyle and getting preventive care are important in promoting health and wellness. Ask your health care provider about:  The right schedule for you to have regular tests and exams.  Things you can do on your own to prevent diseases and keep yourself healthy.  What should I know about diet, weight, and exercise?  Eat a healthy diet    Eat a diet that includes plenty of vegetables, fruits, low-fat dairy products, and lean protein.  Do not eat a lot of foods that are high in solid fats, added sugars, or sodium.  Maintain a healthy weight  Body mass index (BMI) is a measurement that can be used to identify possible weight problems. It estimates body fat based on height and weight. Your health care provider can help determine your BMI and help you achieve or maintain a healthy weight.  Get regular exercise  Get regular exercise. This is one of the most important things you can do for your health. Most adults should:  Exercise for at least 150 minutes each week. The exercise should increase your heart rate and make you sweat (moderate-intensity exercise).  Do strengthening exercises at least twice a week. This is in addition to the moderate-intensity exercise.  Spend less time sitting. Even light physical activity can be beneficial.  Watch cholesterol and blood lipids  Have your blood tested for lipids and cholesterol at 40 years of age, then have this test every 5 years.  You may need to have your cholesterol levels checked more often if:  Your lipid or cholesterol levels are high.  You are older than 40 years of age.  You are at high risk for heart disease.  What should I know about cancer screening?  Many types of cancers can be detected early and may often be prevented. Depending on your health history and family history, you may need to have cancer screening at various ages. This may include screening for:  Colorectal cancer.  Prostate cancer.  Skin cancer.  Lung  cancer.  What should I know about heart disease, diabetes, and high blood pressure?  Blood pressure and heart disease  High blood pressure causes heart disease and increases the risk of stroke. This is more likely to develop in people who have high blood pressure readings or are overweight.  Talk with your health care provider about your target blood pressure readings.  Have your blood pressure checked:  Every 3-5 years if you are 24-52 years of age.  Every year if you are 3 years old or older.  If you are between the ages of 60 and 72 and are a current or former smoker, ask your health care provider if you should have a one-time screening for abdominal aortic aneurysm (AAA).  Diabetes  Have regular diabetes screenings. This checks your fasting blood sugar level. Have the screening done:  Once every three years after age 66 if you are at a normal weight and have a low risk for diabetes.  More often and at a younger age if you are overweight or have a high risk for diabetes.  What should I know about preventing infection?  Hepatitis B  If you have a higher risk for hepatitis B, you should be screened for this virus. Talk with your health care provider to find out if you are at risk for hepatitis B infection.  Hepatitis C  Blood testing is recommended for:  Everyone born from [birth year] through 1967.  Anyone  with known risk factors for hepatitis C.  Sexually transmitted infections (STIs)  You should be screened each year for STIs, including gonorrhea and chlamydia, if:  You are sexually active and are younger than 40 years of age.  You are older than 40 years of age and your health care provider tells you that you are at risk for this type of infection.  Your sexual activity has changed since you were last screened, and you are at increased risk for chlamydia or gonorrhea. Ask your health care provider if you are at risk.  Ask your health care provider about whether you are at high risk for HIV. Your health care provider  may recommend a prescription medicine to help prevent HIV infection. If you choose to take medicine to prevent HIV, you should first get tested for HIV. You should then be tested every 3 months for as long as you are taking the medicine.  Follow these instructions at home:  Alcohol use  Do not drink alcohol if your health care provider tells you not to drink.  If you drink alcohol:  Limit how much you have to 0-2 drinks a day.  Know how much alcohol is in your drink. In the [place] one drink equals one 12 oz bottle of beer (355 mL), one 5 oz glass of wine (148 mL), or one 1 oz glass of hard liquor (44 mL).  Lifestyle  Do not use any products that contain nicotine or tobacco. These products include cigarettes, chewing tobacco, and vaping devices, such as e-cigarettes. If you need help quitting, ask your health care provider.  Do not use street drugs.  Do not share needles.  Ask your health care provider for help if you need support or information about quitting drugs.  General instructions  Schedule regular health, dental, and eye exams.  Stay current with your vaccines.  Tell your health care provider if:  You often feel depressed.  You have ever been abused or do not feel safe at home.  Summary  Adopting a healthy lifestyle and getting preventive care are important in promoting health and wellness.  Follow your health care provider's instructions about healthy diet, exercising, and getting tested or screened for diseases.  Follow your health care provider's instructions on monitoring your cholesterol and blood pressure.  This information is not intended to replace advice given to you by your health care provider. Make sure you discuss any questions you have with your health care provider.  Document Revised: 04/16/2021 Document Reviewed: 04/16/2021  Elsevier Patient Education  2024 [employer].

## 2024-08-04 ENCOUNTER — Other Ambulatory Visit (INDEPENDENT_AMBULATORY_CARE_PROVIDER_SITE_OTHER)

## 2024-08-04 DIAGNOSIS — Z13220 Encounter for screening for lipoid disorders: Secondary | ICD-10-CM

## 2024-08-04 DIAGNOSIS — Z0000 Encounter for general adult medical examination without abnormal findings: Secondary | ICD-10-CM | POA: Diagnosis not present

## 2024-08-04 LAB — COMPREHENSIVE METABOLIC PANEL WITH GFR
ALT: 15 U/L (ref 0–53)
AST: 18 U/L (ref 0–37)
Albumin: 4.6 g/dL (ref 3.5–5.2)
Alkaline Phosphatase: 50 U/L (ref 39–117)
BUN: 15 mg/dL (ref 6–23)
CO2: 30 meq/L (ref 19–32)
Calcium: 9.6 mg/dL (ref 8.4–10.5)
Chloride: 102 meq/L (ref 96–112)
Creatinine, Ser: 1.02 mg/dL (ref 0.40–1.50)
GFR: 92.24 mL/min (ref >60.00)
Glucose, Bld: 92 mg/dL (ref 70–99)
Potassium: 4.2 meq/L (ref 3.5–5.1)
Sodium: 139 meq/L (ref 135–145)
Total Bilirubin: 0.7 mg/dL (ref 0.2–1.2)
Total Protein: 7.6 g/dL (ref 6.0–8.3)

## 2024-08-04 LAB — LIPID PANEL
Cholesterol: 214 mg/dL — ABNORMAL HIGH (ref 0–200)
HDL: 59 mg/dL (ref >39.00)
LDL Cholesterol: 133 mg/dL — ABNORMAL HIGH (ref 0–99)
NonHDL: 155.48
Total CHOL/HDL Ratio: 4
Triglycerides: 113 mg/dL (ref 0.0–149.0)
VLDL: 22.6 mg/dL (ref 0.0–40.0)

## 2024-08-06 ENCOUNTER — Ambulatory Visit: Payer: Self-pay | Admitting: Family Medicine

## 2024-09-06 ENCOUNTER — Telehealth: Admitting: Physician Assistant

## 2024-09-06 DIAGNOSIS — M5412 Radiculopathy, cervical region: Secondary | ICD-10-CM

## 2024-09-06 MED ORDER — CYCLOBENZAPRINE HCL 10 MG PO TABS: 5.0000 mg | ORAL_TABLET | Freq: Three times a day (TID) | ORAL | 0 refills | Status: DC | PRN

## 2024-09-06 MED ORDER — METHYLPREDNISOLONE 4 MG PO TBPK: ORAL_TABLET | ORAL | 0 refills | Status: AC

## 2024-09-06 NOTE — Progress Notes (Signed)
 We are sorry that you are not feeling well.  Here is how we plan to help!  Based on what you have shared with me it looks like you mostly have acute on chronic neck pain.  I have prescribed Medrol dose pack, a steroid anti-inflammatory, as well as Flexeril 10 mg every eight hours as needed which is a muscle relaxer  Some patients experience stomach irritation or in increased heartburn with anti-inflammatory drugs.  Please keep in mind that muscle relaxer's can cause fatigue and should not be taken while at work or driving.  Back pain is very common.  The pain often gets better over time.  The cause of back pain is usually not dangerous.  Most people can learn to manage their back pain on their own.  Home Care Stay active.  Start with short walks on flat ground if you can.  Try to walk farther each day. Do not sit, drive or stand in one place for more than 30 minutes.  Do not stay in bed. Do not avoid exercise or work.  Activity can help your back heal faster. Be careful when you bend or lift an object.  Bend at your knees, keep the object close to you, and do not twist. Sleep on a firm mattress.  Lie on your side, and bend your knees.  If you lie on your back, put a pillow under your knees. Only take medicines as told by your doctor. Put ice on the injured area. Put ice in a plastic bag Place a towel between your skin and the bag Leave the ice on for 15-20 minutes, 3-4 times a day for the first 2-3 days. 210 After that, you can switch between ice and heat packs. Ask your doctor about back exercises or massage. Avoid feeling anxious or stressed.  Find good ways to deal with stress, such as exercise.  Get Help Right Way If: Your pain does not go away with rest or medicine. Your pain does not go away in 1 week. You have new problems. You do not feel well. The pain spreads into your legs. You cannot control when you poop (bowel movement) or pee (urinate) You feel sick to your stomach  (nauseous) or throw up (vomit) You have belly (abdominal) pain. You feel like you may pass out (faint). If you develop a fever.  Make Sure you: Understand these instructions. Will watch your condition Will get help right away if you are not doing well or get worse.  Your e-visit answers were reviewed by a board certified advanced clinical practitioner to complete your personal care plan.  Depending on the condition, your plan could have included both over the counter or prescription medications.  If there is a problem please reply  once you have received a response from your provider.  Your safety is important to us .  If you have drug allergies check your prescription carefully.    You can use MyChart to ask questions about today's visit, request a non-urgent call back, or ask for a work or school excuse for 24 hours related to this e-Visit. If it has been greater than 24 hours you will need to follow up with your provider, or enter a new e-Visit to address those concerns.  You will get an e-mail in the next two days asking about your experience.  I hope that your e-visit has been valuable and will speed your recovery. Thank you for using e-visits.   I have spent 5 minutes in  review of e-visit questionnaire, review and updating patient chart, medical decision making and response to patient.   Delon CHRISTELLA Dickinson, PA-C

## 2024-09-08 ENCOUNTER — Other Ambulatory Visit: Payer: Self-pay | Admitting: Orthopedic Surgery

## 2024-09-08 DIAGNOSIS — M5412 Radiculopathy, cervical region: Secondary | ICD-10-CM

## 2024-09-28 NOTE — Discharge Instructions (Signed)

## 2024-09-29 ENCOUNTER — Ambulatory Visit
Admission: RE | Admit: 2024-09-29 | Discharge: 2024-09-29 | Disposition: A | Discharge status: Home / Self Care | Source: Ambulatory Visit | Attending: Orthopedic Surgery | Admitting: Orthopedic Surgery

## 2024-09-29 DIAGNOSIS — M5412 Radiculopathy, cervical region: Secondary | ICD-10-CM

## 2024-09-29 MED ORDER — TRIAMCINOLONE ACETONIDE 40 MG/ML IJ SUSP (RADIOLOGY)
60.0000 mg | Freq: Once | INTRAMUSCULAR | Status: AC
  Administered 2024-09-29: 60 mg via EPIDURAL

## 2024-09-29 MED ORDER — IOPAMIDOL (ISOVUE-M 300) INJECTION 61%
1.0000 mL | Freq: Once | INTRAMUSCULAR | Status: AC | PRN
  Administered 2024-09-29: 1 mL via EPIDURAL

## 2024-11-13 ENCOUNTER — Telehealth: Admitting: Family Medicine

## 2024-11-13 DIAGNOSIS — M5412 Radiculopathy, cervical region: Secondary | ICD-10-CM

## 2024-11-13 MED ORDER — CYCLOBENZAPRINE HCL 10 MG PO TABS: 5.0000 mg | ORAL_TABLET | Freq: Three times a day (TID) | ORAL | 0 refills | Status: AC | PRN

## 2024-11-13 MED ORDER — NAPROXEN 500 MG PO TABS: 500.0000 mg | ORAL_TABLET | Freq: Two times a day (BID) | ORAL | 0 refills | Status: AC

## 2024-11-13 NOTE — Progress Notes (Signed)

## 2024-11-22 DIAGNOSIS — M542 Cervicalgia: Secondary | ICD-10-CM | POA: Diagnosis not present

## 2024-11-22 DIAGNOSIS — G8929 Other chronic pain: Secondary | ICD-10-CM | POA: Diagnosis not present

## 2024-11-22 DIAGNOSIS — M5412 Radiculopathy, cervical region: Secondary | ICD-10-CM | POA: Diagnosis not present

## 2024-11-24 DIAGNOSIS — M799 Soft tissue disorder, unspecified: Secondary | ICD-10-CM | POA: Diagnosis not present

## 2024-11-24 DIAGNOSIS — M6281 Muscle weakness (generalized): Secondary | ICD-10-CM | POA: Diagnosis not present

## 2024-11-24 DIAGNOSIS — M2560 Stiffness of unspecified joint, not elsewhere classified: Secondary | ICD-10-CM | POA: Diagnosis not present

## 2024-11-24 DIAGNOSIS — M542 Cervicalgia: Secondary | ICD-10-CM | POA: Diagnosis not present

## 2024-11-28 DIAGNOSIS — M542 Cervicalgia: Secondary | ICD-10-CM | POA: Diagnosis not present

## 2024-11-28 DIAGNOSIS — M799 Soft tissue disorder, unspecified: Secondary | ICD-10-CM | POA: Diagnosis not present

## 2024-11-28 DIAGNOSIS — M2560 Stiffness of unspecified joint, not elsewhere classified: Secondary | ICD-10-CM | POA: Diagnosis not present

## 2024-11-28 DIAGNOSIS — M6281 Muscle weakness (generalized): Secondary | ICD-10-CM | POA: Diagnosis not present

## 2024-12-01 DIAGNOSIS — M6281 Muscle weakness (generalized): Secondary | ICD-10-CM | POA: Diagnosis not present

## 2024-12-01 DIAGNOSIS — M799 Soft tissue disorder, unspecified: Secondary | ICD-10-CM | POA: Diagnosis not present

## 2024-12-01 DIAGNOSIS — M542 Cervicalgia: Secondary | ICD-10-CM | POA: Diagnosis not present

## 2024-12-01 DIAGNOSIS — M2560 Stiffness of unspecified joint, not elsewhere classified: Secondary | ICD-10-CM | POA: Diagnosis not present

## 2024-12-07 DIAGNOSIS — M5412 Radiculopathy, cervical region: Secondary | ICD-10-CM | POA: Diagnosis not present
